# Patient Record
Sex: Female | Born: 1935 | Race: White | Hispanic: No | Smoking: Never smoker
Health system: Southern US, Community
[De-identification: ages and names within clinical notes are randomized; demographics above are authoritative.]

## PROBLEM LIST (undated history)

## (undated) DIAGNOSIS — R7611 Nonspecific reaction to tuberculin skin test without active tuberculosis: Secondary | ICD-10-CM

## (undated) DIAGNOSIS — N183 Chronic kidney disease, stage 3 (moderate): Secondary | ICD-10-CM

## (undated) DIAGNOSIS — I2510 Atherosclerotic heart disease of native coronary artery without angina pectoris: Secondary | ICD-10-CM

## (undated) DIAGNOSIS — F32A Depression, unspecified: Secondary | ICD-10-CM

## (undated) DIAGNOSIS — M353 Polymyalgia rheumatica: Secondary | ICD-10-CM

## (undated) DIAGNOSIS — D649 Anemia, unspecified: Secondary | ICD-10-CM

## (undated) DIAGNOSIS — E039 Hypothyroidism, unspecified: Secondary | ICD-10-CM

## (undated) DIAGNOSIS — Z8719 Personal history of other diseases of the digestive system: Secondary | ICD-10-CM

## (undated) DIAGNOSIS — I10 Essential (primary) hypertension: Secondary | ICD-10-CM

## (undated) DIAGNOSIS — D472 Monoclonal gammopathy: Secondary | ICD-10-CM

## (undated) DIAGNOSIS — H3530 Unspecified macular degeneration: Secondary | ICD-10-CM

## (undated) DIAGNOSIS — F0390 Unspecified dementia without behavioral disturbance: Secondary | ICD-10-CM

## (undated) DIAGNOSIS — Z8673 Personal history of transient ischemic attack (TIA), and cerebral infarction without residual deficits: Secondary | ICD-10-CM

## (undated) DIAGNOSIS — E785 Hyperlipidemia, unspecified: Secondary | ICD-10-CM

## (undated) DIAGNOSIS — Z66 Do not resuscitate: Secondary | ICD-10-CM

## (undated) DIAGNOSIS — F329 Major depressive disorder, single episode, unspecified: Secondary | ICD-10-CM

## (undated) DIAGNOSIS — G43909 Migraine, unspecified, not intractable, without status migrainosus: Secondary | ICD-10-CM

## (undated) DIAGNOSIS — M1990 Unspecified osteoarthritis, unspecified site: Secondary | ICD-10-CM

## (undated) DIAGNOSIS — K219 Gastro-esophageal reflux disease without esophagitis: Secondary | ICD-10-CM

## (undated) DIAGNOSIS — K449 Diaphragmatic hernia without obstruction or gangrene: Secondary | ICD-10-CM

## (undated) DIAGNOSIS — H9190 Unspecified hearing loss, unspecified ear: Secondary | ICD-10-CM

## (undated) HISTORY — DX: Anemia, unspecified: D64.9

## (undated) HISTORY — DX: Nonspecific reaction to tuberculin skin test without active tuberculosis: R76.11

## (undated) HISTORY — DX: Polymyalgia rheumatica: M35.3

## (undated) HISTORY — DX: Diaphragmatic hernia without obstruction or gangrene: K44.9

## (undated) HISTORY — PX: THYROIDECTOMY: SHX17

## (undated) HISTORY — DX: Do not resuscitate: Z66

## (undated) HISTORY — DX: Unspecified osteoarthritis, unspecified site: M19.90

## (undated) HISTORY — DX: Major depressive disorder, single episode, unspecified: F32.9

## (undated) HISTORY — DX: Gastro-esophageal reflux disease without esophagitis: K21.9

## (undated) HISTORY — DX: Hypothyroidism, unspecified: E03.9

## (undated) HISTORY — DX: Depression, unspecified: F32.A

## (undated) HISTORY — PX: CHOLECYSTECTOMY: SHX55

## (undated) HISTORY — DX: Monoclonal gammopathy: D47.2

## (undated) HISTORY — DX: Personal history of other diseases of the digestive system: Z87.19

## (undated) HISTORY — PX: APPENDECTOMY: SHX54

## (undated) HISTORY — DX: Hyperlipidemia, unspecified: E78.5

## (undated) HISTORY — DX: Unspecified hearing loss, unspecified ear: H91.90

## (undated) HISTORY — DX: Essential (primary) hypertension: I10

## (undated) HISTORY — DX: Atherosclerotic heart disease of native coronary artery without angina pectoris: I25.10

## (undated) HISTORY — PX: DILATION AND CURETTAGE OF UTERUS: SHX78

## (undated) HISTORY — DX: Unspecified macular degeneration: H35.30

## (undated) HISTORY — DX: Personal history of transient ischemic attack (TIA), and cerebral infarction without residual deficits: Z86.73

## (undated) HISTORY — DX: Chronic kidney disease, stage 3 (moderate): N18.3

## (undated) HISTORY — DX: Migraine, unspecified, not intractable, without status migrainosus: G43.909

## (undated) HISTORY — DX: Unspecified dementia, unspecified severity, without behavioral disturbance, psychotic disturbance, mood disturbance, and anxiety: F03.90

---

## 2004-12-27 HISTORY — PX: COLONOSCOPY: SHX174

## 2005-12-27 HISTORY — PX: OTHER SURGICAL HISTORY: SHX169

## 2007-12-28 DIAGNOSIS — Z8673 Personal history of transient ischemic attack (TIA), and cerebral infarction without residual deficits: Secondary | ICD-10-CM

## 2007-12-28 HISTORY — DX: Personal history of transient ischemic attack (TIA), and cerebral infarction without residual deficits: Z86.73

## 2008-12-27 DIAGNOSIS — Z8719 Personal history of other diseases of the digestive system: Secondary | ICD-10-CM

## 2008-12-27 HISTORY — PX: OTHER SURGICAL HISTORY: SHX169

## 2008-12-27 HISTORY — DX: Personal history of other diseases of the digestive system: Z87.19

## 2009-06-02 ENCOUNTER — Encounter: Payer: Self-pay | Admitting: Internal Medicine

## 2009-07-27 HISTORY — PX: ESOPHAGOGASTRODUODENOSCOPY: SHX1529

## 2009-07-28 ENCOUNTER — Encounter: Payer: Self-pay | Admitting: Internal Medicine

## 2009-12-27 HISTORY — PX: OTHER SURGICAL HISTORY: SHX169

## 2010-01-16 LAB — SEDIMENTATION RATE: Sed Rate: 93 mm

## 2010-01-16 LAB — COMPREHENSIVE METABOLIC PANEL
ALT: 12 U/L (ref 7–35)
BUN: 16 mg/dL (ref 4–21)
Calcium: 9.4 mg/dL
Chloride: 99 mmol/L
Creat: 1.1
Glucose: 93
Potassium: 4.4 mmol/L
Total Bilirubin: 0.6 mg/dL

## 2010-02-15 ENCOUNTER — Ambulatory Visit: Payer: Self-pay | Admitting: Cardiovascular Disease

## 2010-02-15 ENCOUNTER — Inpatient Hospital Stay: Payer: Self-pay | Admitting: *Deleted

## 2010-09-08 ENCOUNTER — Observation Stay: Payer: Self-pay | Admitting: Internal Medicine

## 2010-09-08 ENCOUNTER — Encounter: Payer: Self-pay | Admitting: Internal Medicine

## 2010-09-09 ENCOUNTER — Encounter: Payer: Self-pay | Admitting: Cardiology

## 2010-09-24 ENCOUNTER — Ambulatory Visit: Payer: Self-pay | Admitting: Internal Medicine

## 2010-10-20 LAB — FERRITIN: Ferritin: 58 ng/mL (ref 9.0–150.0)

## 2010-10-20 LAB — CBC AND DIFFERENTIAL
HCT: 0 %
Hemoglobin: 10.2 g/dL — AB (ref 12.0–16.0)
MCV: 96 fL

## 2010-12-08 ENCOUNTER — Inpatient Hospital Stay: Payer: Self-pay | Admitting: Cardiovascular Disease

## 2010-12-09 ENCOUNTER — Encounter: Payer: Self-pay | Admitting: Internal Medicine

## 2011-01-26 NOTE — Assessment & Plan Note (Signed)
Summary: NP6/AMD   Visit Type:  Initial Consult Primary Becky Adkins:  Becky Adkins.  CC:  F/U [facility].  She has been in and out of the hospital since February with chest pain.Marland Kitchen  History of Present Illness: Becky Adkins is 77 y/o woman (mother-in-law of Becky Adkins) with multiple medical problems including HTN, hyperlipidemia, polymyalgia rheumatic, GIB with gastric ulcer 1/11 and short-term memory loss.  Began to have CP in June 2010. Had GI eval and nuclear study at [facility]. Nuclear study showed EF 70% with normal perfusion. EGD showed severe GERD and hiatal hernia.   In January 2011, had severe GIB due to gastric ulcer. Had recurrent CP in Feburary 2011. Admitted to [facility] and had another stress test which was normal. Admitted 2 weeks ago to [facility] with recurrent CP. Cardiac markers normal. Treated medically.   Now lives in assisted living center. Very independent. Does all ADLs without problems. Has not had recurrent CP since episode 2 weeks ago.  Very active. Walks 20-30 mins/day without CP or SOB. No swelling, othorpnea or PND. Takes chronic prednisone for PMR.   Preventive Screening-Counseling & Management  Alcohol-Tobacco     Smoking Status: never  Caffeine-Diet-Exercise     Does Patient Exercise: yes      Drug Use:  no.    Current Medications (verified): 1)  Nexium 40 Mg Cpdr (Esomeprazole Magnesium) .... One Tablet Once Daily 2)  Prednisone 5 Mg/71ml Soln (Prednisone) .... One Tablet Once Daily 3)  Citalopram Hydrobromide 20 Mg Tabs (Citalopram Hydrobromide) .... 1/2 Tablet Once Daily 4)  Synthroid 88 Mcg Tabs (Levothyroxine Sodium) .... One Tablet Once Daily 5)  Losartan Potassium 50 Mg Tabs (Losartan Potassium) .... One Tablet Once Daily 6)  Aspir-Low 81 Mg Tbec (Aspirin) .... One Tablet Once Daily 7)  Coreg 3.125 Mg Tabs (Carvedilol) .... One Tablet Once Daily 8)  Preservision Areds 2  Caps (Multiple Vitamins-Minerals) .... One Tablet Two Times A Day  Allergies  (verified): 1)  ! Pcn 2)  ! Codeine  Past History:  Past Surgical History: Last updated: 2010/10/12 thyroidectomy appendectomy  Family History: Last updated: 10/12/2010 Mother: Deceased; cancer Father: CAD  Social History: Last updated: 10/12/2010 Widowed  Tobacco Use - No.  Alcohol Use - no Regular Exercise - yes--walks Drug Use - no Retired--R.N.  Risk Factors: Exercise: yes (10-12-2010)  Risk Factors: Smoking Status: never (10-12-10)  Past Medical History: Hypertension Hypothyroidism Hyperlipidemia Polymyalgia rheumatica   --on chronic prednisone CP   --Myoview at [facility] 6/11. EF 70% normal perfusion   --Myoview 2/11 [facility]) - normal osteoporosis hearing loss dementia peptic ulcer disease not otherwise specified   --GIB 1/11 requiring transfusion hiatal hernia TIA  Family History: Reviewed history and no changes required. Mother: Deceased; cancer Father: CAD  Social History: Reviewed history and no changes required. Widowed  Tobacco Use - No.  Alcohol Use - no Regular Exercise - yes--walks Drug Use - no Retired--R.N. Smoking Status:  never Does Patient Exercise:  yes Drug Use:  no  Review of Systems       As per HPI and past medical history; otherwise all systems negative.   Vital Signs:  Patient profile:   77 year old female Height:      61 inches Weight:      148 pounds BMI:     28.07 Pulse rate:   72 / minute BP sitting:   130 / 80  (left arm)  Vitals Entered By: Bishop Dublin, CMA (October 12, 2010 3:22 PM)  Physical Exam  General:  Elderly. somewhat frail no resp difficulty HEENT: normal Neck: supple. no JVD. Carotids 2+ bilat; no bruits. No lymphadenopathy or thryomegaly appreciated. Cor: PMI nondisplaced. Regular rate & rhythm. No rubs, gallops, murmur. Lungs: clear Abdomen: soft, nontender, nondistended. Good bowel sounds. Extremities: no cyanosis, clubbing, rash, edema. + arthritic changes Neuro: alert &  orientedx3, cranial nerves grossly intact. moves all 4 extremities w/o difficulty. affect pleasant    Impression & Recommendations:  Problem # 1:  CHEST PAIN UNSPECIFIED (ICD-786.50) Given normal stres tests, I suspect CP is non-cardiac but she does have multiple CRFs.  I discussed options with her and her son of how to proceed  1) Continue to watch and wait 2) Proceed with diagnostic heart cath 3) cardiacCT (likely self-pay)  We also discussed the fact that if she had blockage she would require dual anti-platelet therapy for at least 30 days which would carry risk of GIB. Given the fact that she is asx at this point will continue medical therapy. If symptoms recur in near future can pursue further work-up. I have given them my cell number and her daughter-in-law (who is cardia nurse) can call me at any time.   Other Orders: EKG w/ Interpretation (93000)  Patient Instructions: 1)  Return in 4-6 months or sooner, if needed.

## 2011-01-26 NOTE — Procedures (Signed)
Summary: Upper GI Endoscopy  Upper GI Endoscopy   Imported By: Roderic Ovens 10/15/2010 10:26:27  _____________________________________________________________________  External Attachment:    Type:   Image     Comment:   External Document

## 2011-01-26 NOTE — Letter (Signed)
Summary: Patient Receipt of NNP  Patient Receipt of NNP   Imported By: Marylou Mccoy 10/07/2010 13:24:38  _____________________________________________________________________  External Attachment:    Type:   Image     Comment:   External Document

## 2011-01-26 NOTE — Letter (Signed)
[facility]   Imported By: Harlon Flor 09/14/2010 12:12:49  _____________________________________________________________________  External Attachment:    Type:   Image     Comment:   External Document

## 2011-01-28 NOTE — Cardiovascular Report (Signed)
SummaryScientist, physiological [facility] Cath Report   [facility] Cath Report   Imported By: Roderic Ovens 12/31/2010 15:38:02  _____________________________________________________________________  External Attachment:    Type:   Image     Comment:   External Document

## 2011-02-04 ENCOUNTER — Telehealth (INDEPENDENT_AMBULATORY_CARE_PROVIDER_SITE_OTHER): Payer: Self-pay | Admitting: *Deleted

## 2011-02-11 NOTE — Progress Notes (Signed)
Summary: Called pt  Phone Note Outgoing Call Call back at [phone]   Call placed by: Harlon Flor,  February 04, 2011 3:24 PM Call placed to: Son Summary of Call: [facility] for son TCB to schedule appt.  He had LMOM for [place] to call him regarding this. Initial call taken by: Harlon Flor,  February 04, 2011 3:25 PM

## 2011-03-23 ENCOUNTER — Ambulatory Visit (INDEPENDENT_AMBULATORY_CARE_PROVIDER_SITE_OTHER): Payer: Medicare Other | Admitting: Internal Medicine

## 2011-03-23 ENCOUNTER — Encounter: Payer: Self-pay | Admitting: Internal Medicine

## 2011-03-23 VITALS — BP 108/62 | HR 66 | Ht 61.0 in | Wt 153.0 lb

## 2011-03-23 DIAGNOSIS — I2510 Atherosclerotic heart disease of native coronary artery without angina pectoris: Secondary | ICD-10-CM

## 2011-03-23 DIAGNOSIS — I25118 Atherosclerotic heart disease of native coronary artery with other forms of angina pectoris: Secondary | ICD-10-CM | POA: Insufficient documentation

## 2011-03-23 DIAGNOSIS — E785 Hyperlipidemia, unspecified: Secondary | ICD-10-CM

## 2011-03-23 MED ORDER — PRAVASTATIN SODIUM 20 MG PO TABS: 20.0000 mg | ORAL_TABLET | Freq: Every evening | ORAL | Status: DC

## 2011-03-23 MED ORDER — LOSARTAN POTASSIUM 25 MG PO TABS: 25.0000 mg | ORAL_TABLET | Freq: Every day | ORAL | Status: DC

## 2011-03-23 MED ORDER — ISOSORBIDE MONONITRATE ER 30 MG PO TB24: 30.0000 mg | ORAL_TABLET | Freq: Two times a day (BID) | ORAL | Status: DC

## 2011-03-23 NOTE — Assessment & Plan Note (Signed)
As above, starting pravastatin.

## 2011-03-23 NOTE — Progress Notes (Signed)
HPI:  Becky Adkins is 77 y/o woman (mother-in-law of Diane Prospero) with multiple medical problems including HTN, hyperlipidemia, polymyalgia rheumatic, GIB with gastric ulcer 1/11 and short-term memory loss.  Began to have CP in June 2010. Had GI eval and nuclear study at [facility]. Nuclear study showed EF 70% with normal perfusion. EGD showed severe GERD and hiatal hernia.   In January 2011, had severe GIB due to gastric ulcer. Had several episodes of recurrent CP and then underwent cath 12/11 at [facility]. Showed severe 3-v CAD  Including LM 80% LAD 70% LCX 90% RCA 50-60%. Transferred to [facility] to discuss options regarding CABG vs LM stent. Saw Dr. Nolon Rod. Given comorbidities decide to treat medically after review with Tretha Sciara and Nathaniel Man.   Currently staying at [facility]. She denies any CP or pressure although nurses at there report to family that she occasionally reports CP. On Saturday they were walking in mall and had episode of exertional dyspnea. No CP or diaphoresis. Continues to have severe problems with her memory. Continues on prednisone for PMR. Unable tolerate atorva in past.     ROS: All systems negative except as listed in HPI, PMH and Problem List.  Past Medical History  Diagnosis Date  . Hypertension   . Hyperlipidemia   . Polymyalgia rheumatica   . GIB (gastrointestinal bleeding) 12/27/2009    w/ gastric ulcer  . Short-term memory loss   . Hiatal hernia   . TIA (transient ischemic attack)     Current Outpatient Prescriptions  Medication Sig Dispense Refill  . aspirin 81 MG EC tablet Take 81 mg by mouth daily.        . citalopram (CELEXA) 20 MG tablet Take 10 mg by mouth daily.        . isosorbide mononitrate (IMDUR) 30 MG 24 hr tablet Take 30 mg by mouth daily.        Marland Kitchen levothyroxine (SYNTHROID, LEVOTHROID) 88 MCG tablet Take 88 mcg by mouth daily.        Marland Kitchen losartan (COZAAR) 50 MG tablet Take 50 mg by mouth daily.        . metoprolol tartrate (LOPRESSOR) 25 MG tablet  Take 25 mg by mouth 2 (two) times daily.        . Multiple Vitamins-Minerals (PRESERVISION AREDS 2 PO) Take 1 capsule by mouth 2 (two) times daily.        . nitroGLYCERIN (NITROSTAT) 0.4 MG SL tablet Place 0.4 mg under the tongue every 5 (five) minutes as needed.        . pantoprazole (PROTONIX) 40 MG tablet Take 40 mg by mouth daily.        . predniSONE 5 MG/ML concentrated solution Take 5 mg by mouth daily.        . carvedilol (COREG) 3.125 MG tablet Take 3.125 mg by mouth daily.        Marland Kitchen esomeprazole (NEXIUM) 40 MG capsule Take 40 mg by mouth daily before breakfast.           PHYSICAL EXAM: Filed Vitals:   03/23/11 1555  BP: 108/62  Pulse: 66  General:  Elderly. somewhat frail no resp difficulty HEENT: normal Neck: supple. no JVD. Carotids 2+ bilat; no bruits. No lymphadenopathy or thryomegaly appreciated. Old thyroid scar Cor: PMI nondisplaced. Regular rate & rhythm. No rubs, gallops, murmur. Lungs: clear Abdomen: soft, nontender, nondistended. Good bowel sounds. Extremities: no cyanosis, clubbing, rash, edema. + arthritic changes Neuro: alert & orientedx3, cranial nerves grossly intact. moves  all 4 extremities w/o difficulty. affect pleasant   ECG: NSR 66 No ST-T wave abnormalities.     ASSESSMENT & PLAN:

## 2011-03-23 NOTE — Patient Instructions (Addendum)
INCREASE Imdur 30mg  to twice daily. DECREASE Losartan to 25mg  once daily. Your physician recommends you follow a low fat, low cholesterol diet. Your physician recommends that you schedule a follow-up appointment in: 1 month

## 2011-03-23 NOTE — Assessment & Plan Note (Signed)
Severe CAD on cath. Attempting to treat medically. Appears to be having ongoing angina but hard to tell given her memory difficulties. Will increase Imdur to 30 bid. (Cut Losartan to 25 qd to make room for it). Start low-dose pravastatin (as tolerated) to hopefully slow CAD progression. Switch to lowfat diet at [employer].

## 2011-04-28 ENCOUNTER — Ambulatory Visit (INDEPENDENT_AMBULATORY_CARE_PROVIDER_SITE_OTHER): Payer: Medicare Other | Admitting: Cardiovascular Disease

## 2011-04-28 ENCOUNTER — Encounter: Payer: Self-pay | Admitting: Cardiovascular Disease

## 2011-04-28 DIAGNOSIS — E785 Hyperlipidemia, unspecified: Secondary | ICD-10-CM

## 2011-04-28 DIAGNOSIS — I2510 Atherosclerotic heart disease of native coronary artery without angina pectoris: Secondary | ICD-10-CM

## 2011-04-28 DIAGNOSIS — R079 Chest pain, unspecified: Secondary | ICD-10-CM

## 2011-04-28 NOTE — Assessment & Plan Note (Signed)
Severe coronary artery disease, medical management recommended. Currently with no significant chest pain symptoms on isosorbide 30 mg b.i.d..

## 2011-04-28 NOTE — Assessment & Plan Note (Signed)
We have recommended that she continue to treat episodes of chest pain with sublingual nitroglycerin. Continue long-acting nitrates and beta blockers.

## 2011-04-28 NOTE — Patient Instructions (Addendum)
You are doing well. No medication changes were made. We will check your cholesterol on 05/21/2011 (lipid/lft) Please call us if you have new issues that need to be addressed before your next appt.  We will call you for a follow up Appt. In 6 months

## 2011-04-28 NOTE — Progress Notes (Signed)
   Patient ID: Becky Adkins, female    DOB: 1925-10-27, 77 y.o.   MRN: [id number]  HPI Comments: Yolani is 77 y/o woman (mother-in-law of Diane Dineen) with multiple medical problems including HTN, hyperlipidemia, polymyalgia rheumatic, GIB with gastric ulcer 1/11 and short-term memory loss,  severe GIB in January 2011 due to gastric ulcer,  cath 12/11 at [facility] for chest pain,  showed severe 3-v CAD  Including LM 80% LAD 70% LCX 90% RCA 50-60%. Transferred to [facility] to discuss options regarding CABG vs LM stent. Saw Dr. Nolon Rod. Given comorbidities decide to treat medically after review with Tretha Sciara and Nathaniel Man.   Currently staying at [facility]. She denies any CP or pressure. Continues to have severe problems with her memory. Continues on prednisone for PMR. Unable tolerate atorva in past. She is tolerating pravastatin 20 mg daily.  Her son reports that overall she has been relatively stable. She has a stable gait, enjoys life at the nursing home and has not been requiring a high level of care.  EKG shows normal sinus rhythm with rate 65 beats per minute, rare PVC, no significant ST or T wave changes           Review of Systems  Constitutional: Negative.   HENT: Negative.   Eyes: Negative.   Respiratory: Negative.   Cardiovascular: Negative.   Gastrointestinal: Negative.   Musculoskeletal: Positive for gait problem.  Skin: Negative.   Neurological: Negative.        Memory problems  Hematological: Negative.   Psychiatric/Behavioral: Negative.   All other systems reviewed and are negative.    BP 100/68  Pulse 66  Ht 5\' 1"  (1.549 m)  Wt 157 lb (71.215 kg)  BMI 29.67 kg/m2   Physical Exam  Nursing note and vitals reviewed. Constitutional: She appears well-developed and well-nourished.  HENT:  Head: Normocephalic.  Nose: Nose normal.  Mouth/Throat: Oropharynx is clear and moist.  Eyes: Conjunctivae are normal. Pupils are equal, round, and  reactive to light.  Neck: Normal range of motion. Neck supple. No JVD present.  Cardiovascular: Normal rate, regular rhythm, normal heart sounds and intact distal pulses.  Exam reveals no gallop and no friction rub.   No murmur heard. Pulmonary/Chest: Effort normal and breath sounds normal. No respiratory distress. She has no wheezes. She has no rales. She exhibits no tenderness.  Abdominal: Soft. Bowel sounds are normal. She exhibits no distension. There is no tenderness.  Musculoskeletal: Normal range of motion. She exhibits no edema and no tenderness.  Lymphadenopathy:    She has no cervical adenopathy.  Neurological: She is alert. Coordination normal.  Skin: Skin is warm and dry. No rash noted. No erythema.  Psychiatric: She has a normal mood and affect. Her behavior is normal. Thought content normal.         Assessment and Plan

## 2011-04-28 NOTE — Assessment & Plan Note (Signed)
We have recommended that we check her cholesterol later this week. Ideally we'll try to push her cholesterol as low as possible given previous side effects.

## 2011-05-21 ENCOUNTER — Ambulatory Visit (INDEPENDENT_AMBULATORY_CARE_PROVIDER_SITE_OTHER): Payer: Medicare Other | Admitting: *Deleted

## 2011-05-21 DIAGNOSIS — E785 Hyperlipidemia, unspecified: Secondary | ICD-10-CM

## 2011-05-21 LAB — HEPATIC FUNCTION PANEL
AST: 22 U/L (ref 0–37)
Albumin: 3.8 g/dL (ref 3.5–5.2)
Alkaline Phosphatase: 58 U/L (ref 39–117)
Indirect Bilirubin: 0.2 mg/dL (ref 0.0–0.9)
Total Bilirubin: 0.3 mg/dL (ref 0.3–1.2)

## 2011-05-21 LAB — LIPID PANEL
HDL: 54 mg/dL (ref >39)
Triglycerides: 167 mg/dL — ABNORMAL HIGH (ref <150)

## 2011-05-25 ENCOUNTER — Encounter: Payer: Self-pay | Admitting: Cardiovascular Disease

## 2011-07-12 ENCOUNTER — Encounter: Payer: Self-pay | Admitting: Family Medicine

## 2011-07-13 ENCOUNTER — Encounter: Payer: Self-pay | Admitting: Family Medicine

## 2011-07-13 ENCOUNTER — Ambulatory Visit (INDEPENDENT_AMBULATORY_CARE_PROVIDER_SITE_OTHER): Payer: Medicare Other | Admitting: Family Medicine

## 2011-07-13 VITALS — BP 132/78 | HR 64 | Temp 98.1°F | Ht 60.0 in | Wt 159.8 lb

## 2011-07-13 DIAGNOSIS — H3530 Unspecified macular degeneration: Secondary | ICD-10-CM | POA: Insufficient documentation

## 2011-07-13 DIAGNOSIS — F329 Major depressive disorder, single episode, unspecified: Secondary | ICD-10-CM

## 2011-07-13 DIAGNOSIS — M810 Age-related osteoporosis without current pathological fracture: Secondary | ICD-10-CM | POA: Insufficient documentation

## 2011-07-13 DIAGNOSIS — Z8719 Personal history of other diseases of the digestive system: Secondary | ICD-10-CM

## 2011-07-13 DIAGNOSIS — F331 Major depressive disorder, recurrent, moderate: Secondary | ICD-10-CM | POA: Insufficient documentation

## 2011-07-13 DIAGNOSIS — F0390 Unspecified dementia without behavioral disturbance: Secondary | ICD-10-CM

## 2011-07-13 DIAGNOSIS — I2510 Atherosclerotic heart disease of native coronary artery without angina pectoris: Secondary | ICD-10-CM

## 2011-07-13 DIAGNOSIS — E785 Hyperlipidemia, unspecified: Secondary | ICD-10-CM

## 2011-07-13 DIAGNOSIS — Z8673 Personal history of transient ischemic attack (TIA), and cerebral infarction without residual deficits: Secondary | ICD-10-CM

## 2011-07-13 DIAGNOSIS — F32A Depression, unspecified: Secondary | ICD-10-CM

## 2011-07-13 DIAGNOSIS — M353 Polymyalgia rheumatica: Secondary | ICD-10-CM

## 2011-07-13 DIAGNOSIS — K219 Gastro-esophageal reflux disease without esophagitis: Secondary | ICD-10-CM

## 2011-07-13 DIAGNOSIS — E039 Hypothyroidism, unspecified: Secondary | ICD-10-CM

## 2011-07-13 DIAGNOSIS — I10 Essential (primary) hypertension: Secondary | ICD-10-CM

## 2011-07-13 MED ORDER — ATORVASTATIN CALCIUM 20 MG PO TABS: 20.0000 mg | ORAL_TABLET | Freq: Every day | ORAL | Status: DC

## 2011-07-13 NOTE — Assessment & Plan Note (Signed)
Reviewed, input records from prior PCP.

## 2011-07-13 NOTE — Assessment & Plan Note (Signed)
Good control on current regimen. BP: 132/78 mmHg   Goal <130/80.

## 2011-07-13 NOTE — Assessment & Plan Note (Signed)
Per son and last cards note, had discussed changing to more potent statin. Changed to atorva 20mg  daily, advised to watch for myalgias.

## 2011-07-13 NOTE — Assessment & Plan Note (Signed)
Continue celexa 30mg  daily.

## 2011-07-13 NOTE — Assessment & Plan Note (Signed)
Check TSH today

## 2011-07-13 NOTE — Assessment & Plan Note (Signed)
Has seen memory clinic, no formal diagnosis according to son, have noticed improvement since moving to nursing home.  Did not tolerate aricept in past.

## 2011-07-13 NOTE — Patient Instructions (Addendum)
We've changed cholesterol medicine from pravastatin to lipitor (atorvatatin) 20mg  nightly.  Watch out for muscle pain with this med, if that happens let us know. Check about left lung spot seen 12/2009. Blood work today. Check on tetanus, shingles, and pneumonia shots. Return at your convenience for annual medicare wellness visit. Good to meet you today, call us with questions.

## 2011-07-13 NOTE — Assessment & Plan Note (Signed)
On baby ASA, stable.

## 2011-07-13 NOTE — Assessment & Plan Note (Signed)
Stable on ASA, no chest pain. Medically managing 3v disease.

## 2011-07-13 NOTE — Assessment & Plan Note (Signed)
Refer to ophtho

## 2011-07-13 NOTE — Assessment & Plan Note (Signed)
Not candidate for plavix or stronger anti platelet agent other than baby ASA.

## 2011-07-13 NOTE — Assessment & Plan Note (Signed)
No recent dexa noted. Was on boniva. Continue to monitor for now.   Check vit D level today. Likely avoid Ca given CAD.

## 2011-07-13 NOTE — Progress Notes (Signed)
Subjective:    Patient ID: Becky Adkins, female    DOB: 05/14/1925, 77 y.o.   MRN: [id number]  HPI CC: new patient, establish  Presents with son.    Was living alone in [place] until 1 1/2 yr ago.  Now lives in [place], [place].  Overall things going well.  Feels doing better as able to be more social.  Also has noticed significant improvement since increase in celexa to 30mg  daily.  Endorses feeling happy, improved insight into illness, into dementia.  Last vision check was >1 yr ago, requests referral.  Had cataract surgery at [facility] almost 2 yrs ago.  H/o 3v CAD, decided medical management given comorbidities, only on 81mg  ASA.  Denies recent CP/tightness, SOB.  Cards - [place]. S/p thyroidectomy for tumor 1980s, hypothyroid since. H/o PMR - on daily prednisone 5mg .  Dx 1980s. GIB - 2010.  Very serious bleed.  Protonix indefinitely. Osteoporosis/osteopenia - No recent dexa scan. Dementia - ongoing for several decades, has seen memory clinic at [facility], last seen 2008, never formal diagnosis.  Enrolled in study.  More of an issue with short term memory.  Tried aricept, didn't tolerate or really help.  On celexa 30mg .  Medications and allergies reviewed and updated in chart. Patient Active Problem List  Diagnoses  . CHEST PAIN UNSPECIFIED  . CAD (coronary artery disease)  . Hyperlipidemia  . Hypertension  . Polymyalgia rheumatica  . Dementia  . History of GI bleed  . Personal history of TIA (transient ischemic attack)  . Macular degeneration  . GERD (gastroesophageal reflux disease)  . Hypothyroid  . Osteoporosis  . Depression   Past Medical History  Diagnosis Date  . Hypertension   . Hyperlipidemia   . Polymyalgia rheumatica     on prednisone, has seen neuroophthalmologist  . History of GI bleed 12/2008    w/ gastric ulcer  . Dementia     mild, significant short term memory loss  . Hiatal hernia   . Personal history of TIA (transient ischemic attack) 2009     neurology w/u with normal CT head, echo, carotid US  . Anemia     s/p hematology workup  . Macular degeneration     and cataracts  . Deafness     right ear  . GERD (gastroesophageal reflux disease)     severe on EGD with HH, H pylori neg  . Hypothyroid     s/p thyroidectomy  . Osteoporosis     was on boniva  . 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 Migraines     none recently  . Arthritis   . Depression   . Urinary incontinence    Past Surgical History  Procedure Date  . Thyroidectomy   . Appendectomy   . Dilation and curettage of uterus   . Hospitalization 2007    global amnesia  . Hospitalization 12/2008    bleeding ulcer, asp PNA  . Hospitalization 2011    CP, r/o x 2 (neg cardiolite and ETT)  . Esophagogastroduodenoscopy 07/2009    LA grade C reflux esophagitis, HH, single gastric nodule ([place])  . Cholecystectomy    History  Substance Use Topics  . Smoking status: Never Smoker   . Smokeless tobacco: Never Used  . Alcohol Use: Yes     Wine very rarely   Family History  Problem Relation Age of Onset  . Cancer Mother     cervical  .  Coronary artery disease Father   . Alcohol abuse Father   . Diabetes Paternal Aunt    Allergies  Allergen Reactions  . Codeine   . Penicillins    Current Outpatient Prescriptions on File Prior to Visit  Medication Sig Dispense Refill  . aspirin 81 MG EC tablet Take 81 mg by mouth daily.        . citalopram (CELEXA) 20 MG tablet Take 30 mg by mouth daily. 1 1/2 tablets daily      . isosorbide mononitrate (IMDUR) 30 MG 24 hr tablet Take 1 tablet (30 mg total) by mouth 2 (two) times daily.  60 tablet  6  . levothyroxine (SYNTHROID, LEVOTHROID) 88 MCG tablet Take 88 mcg by mouth daily.        Marland Kitchen losartan (COZAAR) 25 MG tablet Take 1 tablet (25 mg total) by mouth daily.  30 tablet  6  . metoprolol tartrate (LOPRESSOR) 25 MG tablet Take 25 mg by mouth 2 (two) times daily.        . Multiple  Vitamins-Minerals (PRESERVISION AREDS 2 PO) Take 1 capsule by mouth 2 (two) times daily.        . nitroGLYCERIN (NITROSTAT) 0.4 MG SL tablet Place 0.4 mg under the tongue every 5 (five) minutes as needed.        . pantoprazole (PROTONIX) 40 MG tablet Take 40 mg by mouth daily.         Review of Systems  Constitutional: Negative for fever, chills, activity change, appetite change, fatigue and unexpected weight change.  HENT: Negative for hearing loss and neck pain.   Eyes: Negative for visual disturbance.  Respiratory: Negative for cough, chest tightness, shortness of breath and wheezing.   Cardiovascular: Negative for chest pain, palpitations and leg swelling.  Gastrointestinal: Negative for nausea, vomiting, abdominal pain, diarrhea, constipation, blood in stool and abdominal distention.  Genitourinary: Negative for hematuria and difficulty urinating.  Musculoskeletal: Negative for myalgias and arthralgias.  Skin: Negative for rash.  Neurological: Negative for dizziness, seizures, syncope and headaches.  Hematological: Does not bruise/bleed easily.  Psychiatric/Behavioral: Negative for dysphoric mood. The patient is not nervous/anxious.        Objective:   Physical Exam  Nursing note and vitals reviewed. Constitutional: She is oriented to person, place, and time. She appears well-developed and well-nourished. No distress.  HENT:  Head: Normocephalic and atraumatic.  Right Ear: Tympanic membrane, external ear and ear canal normal.  Left Ear: Hearing, tympanic membrane, external ear and ear canal normal.  Nose: Nose normal. No mucosal edema or rhinorrhea.  Mouth/Throat: Uvula is midline, oropharynx is clear and moist and mucous membranes are normal. No oropharyngeal exudate, posterior oropharyngeal edema, posterior oropharyngeal erythema or tonsillar abscesses.       R ear hearing loss  Eyes: Conjunctivae and EOM are normal. Pupils are equal, round, and reactive to light. No scleral  icterus.  Neck: Normal range of motion. Neck supple.  Cardiovascular: Normal rate, regular rhythm, normal heart sounds and intact distal pulses.   No murmur heard. Pulses:      Radial pulses are 2+ on the right side, and 2+ on the left side.  Pulmonary/Chest: Effort normal and breath sounds normal. No respiratory distress. She has no wheezes. She has no rales.  Abdominal: Soft. Bowel sounds are normal. She exhibits no distension and no mass. There is no tenderness. There is no rebound and no guarding.  Musculoskeletal: Normal range of motion. She exhibits no edema.  Lymphadenopathy:  She has no cervical adenopathy.  Neurological: She is alert and oriented to person, place, and time.       CN grossly intact, station and gait intact  Skin: Skin is warm and dry. No rash noted.  Psychiatric: She has a normal mood and affect. Her behavior is normal. Judgment and thought content normal.          Assessment & Plan:

## 2011-07-14 LAB — BASIC METABOLIC PANEL
BUN: 24 mg/dL — ABNORMAL HIGH (ref 6–23)
Chloride: 105 mEq/L (ref 96–112)
GFR: 30.89 mL/min — ABNORMAL LOW (ref >60.00)
Glucose, Bld: 113 mg/dL — ABNORMAL HIGH (ref 70–99)
Potassium: 5.1 mEq/L (ref 3.5–5.1)

## 2011-07-14 LAB — CBC WITH DIFFERENTIAL/PLATELET
Basophils Absolute: 0 10*3/uL (ref 0.0–0.1)
Basophils Relative: 0.3 % (ref 0.0–3.0)
Eosinophils Absolute: 0 10*3/uL (ref 0.0–0.7)
Eosinophils Relative: 0.5 % (ref 0.0–5.0)
HCT: 35.7 % — ABNORMAL LOW (ref 36.0–46.0)
Hemoglobin: 11.8 g/dL — ABNORMAL LOW (ref 12.0–15.0)
Lymphocytes Relative: 23.4 % (ref 12.0–46.0)
Lymphs Abs: 1.5 10*3/uL (ref 0.7–4.0)
MCHC: 33.1 g/dL (ref 30.0–36.0)
MCV: 94.7 fl (ref 78.0–100.0)
Monocytes Absolute: 0.4 10*3/uL (ref 0.1–1.0)
Monocytes Relative: 6.3 % (ref 3.0–12.0)
Neutro Abs: 4.4 10*3/uL (ref 1.4–7.7)
Neutrophils Relative %: 69.5 % (ref 43.0–77.0)
Platelets: 210 10*3/uL (ref 150.0–400.0)
RBC: 3.77 Mil/uL — ABNORMAL LOW (ref 3.87–5.11)
RDW: 14.4 % (ref 11.5–14.6)
WBC: 6.3 10*3/uL (ref 4.5–10.5)

## 2011-07-18 ENCOUNTER — Telehealth: Payer: Self-pay | Admitting: Family Medicine

## 2011-07-18 MED ORDER — LEVOTHYROXINE SODIUM 75 MCG PO TABS: 75.0000 ug | ORAL_TABLET | Freq: Every day | ORAL | Status: DC

## 2011-07-18 NOTE — Telephone Encounter (Addendum)
Please notify hemoglobin 11.8. Kidney function returned elevated.  Ensure getting plenty of fluid to stay well hydrated, avoid ibuprofen, advil, motrin, alleve.  Tylenol ok. Thyroid function high which could account for kidney insufficiency.  Would like her to decrease synthroid to daily.  Changed dose in chart and sent new med to pharmacy. Vitamin D level normal.   Sedimentation rate elevated at 102. Would also like her to return mid August for OV and to recheck kidney and thyroid function.

## 2011-07-19 NOTE — Telephone Encounter (Signed)
Patient's son notified and follow up appointment scheduled. New Rx and notes faxed to [facility] at son's request.

## 2011-07-19 NOTE — Telephone Encounter (Signed)
Message left for son to return my call.

## 2011-07-21 ENCOUNTER — Ambulatory Visit: Payer: BC Managed Care – PPO | Admitting: Family Medicine

## 2011-07-22 ENCOUNTER — Ambulatory Visit (INDEPENDENT_AMBULATORY_CARE_PROVIDER_SITE_OTHER): Payer: Medicare Other | Admitting: Family Medicine

## 2011-07-22 ENCOUNTER — Ambulatory Visit (INDEPENDENT_AMBULATORY_CARE_PROVIDER_SITE_OTHER)
Admission: RE | Admit: 2011-07-22 | Discharge: 2011-07-22 | Disposition: A | Discharge status: Home / Self Care | Payer: Medicare Other | Source: Ambulatory Visit | Attending: Family Medicine | Admitting: Family Medicine

## 2011-07-22 ENCOUNTER — Encounter: Payer: Self-pay | Admitting: Family Medicine

## 2011-07-22 DIAGNOSIS — W19XXXA Unspecified fall, initial encounter: Secondary | ICD-10-CM | POA: Insufficient documentation

## 2011-07-22 DIAGNOSIS — M25561 Pain in right knee: Secondary | ICD-10-CM | POA: Insufficient documentation

## 2011-07-22 DIAGNOSIS — M25569 Pain in unspecified knee: Secondary | ICD-10-CM

## 2011-07-22 DIAGNOSIS — M810 Age-related osteoporosis without current pathological fracture: Secondary | ICD-10-CM

## 2011-07-22 NOTE — Progress Notes (Signed)
  Subjective:    Patient ID: Becky Adkins, female    DOB: 1925/03/11, 77 y.o.   MRN: [id number]  HPI CC: fall yesterday  77 yo with some dementia and PMR on daily 5mg  prednisone presents with granddaughter with fall 2 nights ago.  Son thought pt should come be evaluated.  Started walking, rubber sole of shoe got stuck on floor.  Fell down onto knees.  R knee mildly sore.  No premonitory sxs, no dizziness, HA, vision changes.  Anterior knee hurting.  Never has had imbalance with walking in past.    Knee pain worse with bending.  No locking or instability or popping.  No breaks in skin.  Able to ambulate.  Granddaughter thinks that with walking limp has decreased.  No other injury, no head injury or neck injury.  No other recent falls.  Started using walker since fall.  Thinks has been ~2 yrs since last fall.  H/o osteoporosis, h/o PUD, significant h/o CAD, currently getting medical treatment.  Review of Systems Per HPI    Objective:   Physical Exam  Nursing note and vitals reviewed. Constitutional: She appears well-developed and well-nourished. No distress.  HENT:  Head: Normocephalic and atraumatic.  Mouth/Throat: Oropharynx is clear and moist. No oropharyngeal exudate.  Eyes: Conjunctivae and EOM are normal. Pupils are equal, round, and reactive to light. No scleral icterus.  Neck: Normal range of motion. Neck supple.  Musculoskeletal:       FROM at knees. R knee - Neg mcmurrays bilaterally. Neg drawer sign. No pain at patellar ligament, no increase in patellar mobility. No pain at medial or lateral joint lines.   Neurological: She is alert. She has normal strength. No sensory deficit. She displays a negative Romberg sign. Coordination normal.       no pronator drift Walking with walker  Skin: Skin is warm and dry.       Small abrasion anterior R knee inferior to patella + ecchymosis lateral lower leg, tender to palpation anterior leg  Psychiatric: She has a  normal mood and affect.          Assessment & Plan:

## 2011-07-22 NOTE — Assessment & Plan Note (Signed)
Was on boniva.  No recent dexa, consider rpt.  However, holding off on Ca given h/o CAD.  Vit D 41 06/2011.   May need to discuss PO bisphosphonate vs IV bisphosphonate at next visit (h/o PUD).

## 2011-07-22 NOTE — Patient Instructions (Signed)
xRay obtained today -looking ok on my read.  If any change based on radiology read we will call you. I think you have a bony contusion of the lower knee as well as bruising of skin from fall. May use tylenol 500mg  three times a day as needed as well as elevate leg and place ice on it for 15 min at a time. I'd like to set you up with physical therapy to see if you'd benefit from assistive devices to walk.

## 2011-07-22 NOTE — Assessment & Plan Note (Addendum)
Referral to PT to assess need for ambulatory assistive devices.

## 2011-07-22 NOTE — Assessment & Plan Note (Signed)
Xray checked - negative for fracture. Supportive care.  Anticipate bony contusion at anterior superior tibia. Discussed anticipated recovery. Have asked PT to eval ambulation and see if assistive devices would help decrease fall risk, possible cane for stability.

## 2011-07-27 ENCOUNTER — Encounter: Payer: Self-pay | Admitting: Family Medicine

## 2011-07-29 ENCOUNTER — Other Ambulatory Visit: Payer: Self-pay | Admitting: Family Medicine

## 2011-07-29 DIAGNOSIS — M353 Polymyalgia rheumatica: Secondary | ICD-10-CM

## 2011-07-29 DIAGNOSIS — H3530 Unspecified macular degeneration: Secondary | ICD-10-CM

## 2011-07-29 DIAGNOSIS — W19XXXA Unspecified fall, initial encounter: Secondary | ICD-10-CM

## 2011-07-29 DIAGNOSIS — M25561 Pain in right knee: Secondary | ICD-10-CM

## 2011-08-13 ENCOUNTER — Ambulatory Visit (INDEPENDENT_AMBULATORY_CARE_PROVIDER_SITE_OTHER): Payer: Medicare Other | Admitting: Family Medicine

## 2011-08-13 ENCOUNTER — Encounter: Payer: Self-pay | Admitting: Family Medicine

## 2011-08-13 DIAGNOSIS — Z23 Encounter for immunization: Secondary | ICD-10-CM

## 2011-08-13 DIAGNOSIS — N289 Disorder of kidney and ureter, unspecified: Secondary | ICD-10-CM

## 2011-08-13 DIAGNOSIS — M25561 Pain in right knee: Secondary | ICD-10-CM

## 2011-08-13 DIAGNOSIS — W19XXXA Unspecified fall, initial encounter: Secondary | ICD-10-CM

## 2011-08-13 DIAGNOSIS — M353 Polymyalgia rheumatica: Secondary | ICD-10-CM

## 2011-08-13 DIAGNOSIS — N183 Chronic kidney disease, stage 3 unspecified: Secondary | ICD-10-CM

## 2011-08-13 DIAGNOSIS — I2510 Atherosclerotic heart disease of native coronary artery without angina pectoris: Secondary | ICD-10-CM

## 2011-08-13 DIAGNOSIS — M810 Age-related osteoporosis without current pathological fracture: Secondary | ICD-10-CM

## 2011-08-13 DIAGNOSIS — M25569 Pain in unspecified knee: Secondary | ICD-10-CM

## 2011-08-13 DIAGNOSIS — K219 Gastro-esophageal reflux disease without esophagitis: Secondary | ICD-10-CM

## 2011-08-13 DIAGNOSIS — E785 Hyperlipidemia, unspecified: Secondary | ICD-10-CM

## 2011-08-13 DIAGNOSIS — F0390 Unspecified dementia without behavioral disturbance: Secondary | ICD-10-CM

## 2011-08-13 DIAGNOSIS — E039 Hypothyroidism, unspecified: Secondary | ICD-10-CM

## 2011-08-13 DIAGNOSIS — D649 Anemia, unspecified: Secondary | ICD-10-CM

## 2011-08-13 DIAGNOSIS — I10 Essential (primary) hypertension: Secondary | ICD-10-CM

## 2011-08-13 HISTORY — DX: Chronic kidney disease, stage 3 unspecified: N18.30

## 2011-08-13 LAB — BASIC METABOLIC PANEL
BUN: 23 mg/dL (ref 6–23)
Creatinine, Ser: 1.4 mg/dL — ABNORMAL HIGH (ref 0.4–1.2)
GFR: 37.19 mL/min — ABNORMAL LOW (ref >60.00)
Glucose, Bld: 109 mg/dL — ABNORMAL HIGH (ref 70–99)

## 2011-08-13 LAB — TSH: TSH: 0.11 u[IU]/mL — ABNORMAL LOW (ref 0.35–5.50)

## 2011-08-13 NOTE — Patient Instructions (Addendum)
Blood work today to check on kidneys and thyroid function. Start aricept for memory - 5mg  daily.  Watch for stomach upset, if that happens let me know. Try lower calorie diet and I encourage walking daily. Check on shingles shot. Tetanus won't be covered by medicare, but the recommendation is to get every 10 years. Pneumonia shot is due once after age 75. Return at your convenience for annual medicare wellness visit. I'm glad the knee is doing better!  Check on PT for recommendations on use of walker or cane. Pneumonia shot today.  Tetanus shot today (Td).  We will hold off on shingles for now - given on steroids.  Check with rheumatologist regarding this shot.

## 2011-08-13 NOTE — Progress Notes (Signed)
Subjective:    Patient ID: Becky Adkins, female    DOB: 1925/01/22, 77 y.o.   MRN: [id number]  HPI CC: 1 mo f/u  77 yo with some dementia, 3v CAD treating medically with ASA, long acting nitrate and metoprolol, and PMR on daily 5mg  prednisone presents for 1 mo f/u.  Knee pain - resolved.  Seen last month after fall.  Doing well.  Tylenol PRN.  Sent to PT.  Not using any assistive ambulatory device.  Unsure what PT recommended regarding this.  Son will check.  Wt Readings from Last 3 Encounters:  08/13/11 159 lb 12 oz (72.462 kg)  07/22/11 160 lb 12 oz (72.916 kg)  07/13/11 159 lb 12.8 oz (72.485 kg)  weight up 40 lbs in 16 months.  Son worried about this.  Pt does not exercise.  Thinks could start walking more around [facility].  Osteoporosis - previously on boniva.  Discontinued after PUD and GI bleed 2010.  Currently not on Ca supplementation in h/o CAD.  Vit D checked last month was 41.  Sore throat - started today.  No fever, cough, congestion, RN, sneezing.  Tends to get, attributed to GERD in past.  On longterm protonix after PUD/GI bleed.  Dementia - last seen by memory clinic (was in longitudinal study) in 2008, never on dementia meds.  Son pulls me aside after visit with concerns of decline in function - weight gain, deteriorated oral hygiene (seeing dentist for this), deteriorated personal hygiene (cutting nails, etc).  Concern may need increased level of care.  Reviewed records from [facility], no evidence of pneumovax, zostavax or recent tetanus.  H/o shingles in past.  Son requests all three today.  However, discussed we would hold zostavax as pt on chronic prednisone 5mg .  To check with Rheum first.  Medications and allergies reviewed and updated in chart. Patient Active Problem List  Diagnoses  . CHEST PAIN UNSPECIFIED  . CAD (coronary artery disease)  . Hyperlipidemia  . Hypertension  . Polymyalgia rheumatica  . Dementia  . History of GI bleed  . Personal  history of TIA (transient ischemic attack)  . Macular degeneration  . GERD (gastroesophageal reflux disease)  . Hypothyroid  . Osteoporosis  . Depression  . Right knee pain  . Fall  . Renal insufficiency   Past Medical History  Diagnosis Date  . Hypertension   . Hyperlipidemia   . Polymyalgia rheumatica     on prednisone, has seen neuroophthalmologist, had normal temporal artery biopsy at dx  . History of GI bleed 12/2008    w/ submucosal gastric ulcers  . Dementia     mild, significant short term memory loss, eval by memory clinic and didn't think needed further meds  . Hiatal hernia   . Personal history of TIA (transient ischemic attack) 2009    neurology w/u with normal CT head, echo, carotid US  . Anemia     s/p hematology workup  . Macular degeneration     and cataracts  . Deafness     right ear  . GERD (gastroesophageal reflux disease)     severe on EGD with HH, H pylori neg  . Hypothyroid     s/p thyroidectomy  . Osteoporosis     was on boniva  . 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 Migraines     none recently  . Arthritis   . Depression   . Urinary incontinence  Past Surgical History  Procedure Date  . Thyroidectomy     for benign tumor  . Appendectomy   . Dilation and curettage of uterus   . Hospitalization 2007    global amnesia  . Hospitalization 12/2008    bleeding ulcer, asp PNA  . Hospitalization 2011    CP, r/o x 2 (neg cardiolite and ETT)  . Esophagogastroduodenoscopy 07/2009    LA grade C reflux esophagitis, HH, single gastric nodule ([place])  . Cholecystectomy   . Colonoscopy 2006    normal per previous records   History  Substance Use Topics  . Smoking status: Never Smoker   . Smokeless tobacco: Never Used  . Alcohol Use: Yes     Wine very rarely   Family History  Problem Relation Age of Onset  . Cancer Mother     cervical  . Coronary artery disease Father   . Alcohol abuse Father   .  Diabetes Paternal Aunt    Allergies  Allergen Reactions  . Codeine   . Penicillins    Current Outpatient Prescriptions on File Prior to Visit  Medication Sig Dispense Refill  . aspirin 81 MG EC tablet Take 81 mg by mouth daily.        Marland Kitchen atorvastatin (LIPITOR) 20 MG tablet Take 1 tablet (20 mg total) by mouth daily.  30 tablet  11  . citalopram (CELEXA) 20 MG tablet Take 30 mg by mouth daily. 1 1/2 tablets daily      . isosorbide mononitrate (IMDUR) 30 MG 24 hr tablet Take 1 tablet (30 mg total) by mouth 2 (two) times daily.  60 tablet  6  . levothyroxine (SYNTHROID, LEVOTHROID) 75 MCG tablet Take 1 tablet (75 mcg total) by mouth daily.  30 tablet  6  . losartan (COZAAR) 25 MG tablet Take 1 tablet (25 mg total) by mouth daily.  30 tablet  6  . metoprolol tartrate (LOPRESSOR) 25 MG tablet Take 25 mg by mouth 2 (two) times daily.        . Multiple Vitamins-Minerals (PRESERVISION AREDS 2 PO) Take 1 capsule by mouth 2 (two) times daily.        . nitroGLYCERIN (NITROSTAT) 0.4 MG SL tablet Place 0.4 mg under the tongue every 5 (five) minutes as needed.        . pantoprazole (PROTONIX) 40 MG tablet Take 40 mg by mouth daily.        . predniSONE (DELTASONE) 5 MG tablet Take 5 mg by mouth daily. For polymyalgia rhuematrica       . Acetaminophen 500 MG coapsule Take 1 capsule (500 mg total) by mouth 3 (three) times daily as needed for fever or pain.  30 capsule  0   Review of Systems Per HPI    Objective:   Physical Exam  Nursing note and vitals reviewed. Constitutional: She appears well-developed and well-nourished. No distress.  HENT:  Head: Normocephalic and atraumatic.  Mouth/Throat: Oropharynx is clear and moist. No oropharyngeal exudate.  Eyes: Conjunctivae and EOM are normal. Pupils are equal, round, and reactive to light. No scleral icterus.  Neck: Normal range of motion. Neck supple.  Cardiovascular: Normal rate, regular rhythm, normal heart sounds and intact distal pulses.   No  murmur heard. Pulmonary/Chest: Effort normal and breath sounds normal. No respiratory distress. She has no wheezes. She has no rales.  Musculoskeletal: Normal range of motion. She exhibits no edema.  Lymphadenopathy:    She has no cervical adenopathy.  Skin: Skin is  warm and dry. No rash noted.  Psychiatric: She has a normal mood and affect.          Assessment & Plan:

## 2011-08-15 ENCOUNTER — Telehealth: Payer: Self-pay | Admitting: Family Medicine

## 2011-08-15 ENCOUNTER — Encounter: Payer: Self-pay | Admitting: Family Medicine

## 2011-08-15 DIAGNOSIS — D631 Anemia in chronic kidney disease: Secondary | ICD-10-CM | POA: Insufficient documentation

## 2011-08-15 MED ORDER — LEVOTHYROXINE SODIUM 50 MCG PO TABS: 50.0000 ug | ORAL_TABLET | Freq: Every day | ORAL | Status: DC

## 2011-08-15 MED ORDER — DONEPEZIL HCL 5 MG PO TABS: 5.0000 mg | ORAL_TABLET | Freq: Every evening | ORAL | Status: DC | PRN

## 2011-08-15 NOTE — Assessment & Plan Note (Signed)
Mild. Monitor for now

## 2011-08-15 NOTE — Assessment & Plan Note (Signed)
Per son concern for decline in function. Trial of aricept 5mg . Discussed nausea/vomiting, gi upset.  To monitor for any improvement. If tolerating well and improvement noted, will increase to 10mg  daily.

## 2011-08-15 NOTE — Assessment & Plan Note (Signed)
New compared to last check 12/2009.  Also found to be hyperthyroid on replacement last check.  Seems to be staying well hydrated.  BP good control.  Recheck today.

## 2011-08-15 NOTE — Assessment & Plan Note (Signed)
Great control on current meds. ?

## 2011-08-15 NOTE — Assessment & Plan Note (Signed)
Monitor ST for now.  On protonix.

## 2011-08-15 NOTE — Telephone Encounter (Signed)
Please notify kidney function somewhat improved to 1.4 but still higher than where we'd like it to be.   Thyroid function still a bit high.  Recommend back down to synthroid daily.  New script sent in. Will recheck levels when returns in 4-6 wks.

## 2011-08-15 NOTE — Assessment & Plan Note (Signed)
Resolved

## 2011-08-15 NOTE — Assessment & Plan Note (Signed)
Stable on 5mg  prednisone. No visual sxs No AM stiffness, or weakness or muscle/joint pains.

## 2011-08-15 NOTE — Assessment & Plan Note (Signed)
Found high last visit, titrated synthroid accordingly.  recheck today.

## 2011-08-15 NOTE — Assessment & Plan Note (Addendum)
Changed from prava to lipitor 20mg  last month as LDL not at goal, pt tolerating change well. Recheck next fasting blood draw.

## 2011-08-15 NOTE — Assessment & Plan Note (Signed)
asked son to check with PT regarding any advice to help prevent falls in future.

## 2011-08-15 NOTE — Assessment & Plan Note (Addendum)
Previously oral Boniva. Not currently on bisphosphonate or Ca/Vit D.  (Vit D level 06/2011 was 41). No recent DEXA scan. Not likely treat with oral bisphosphonates 2/2 PUD/h/o severe GI bleed. Currently not candidate for IV bisphosphonate given GFR 30s, but if improved consider. Consider prolia (denosumab).   Discussed this with son.  Monitor kidney function for now. If decide to further treat, obtain DEXA first.

## 2011-08-15 NOTE — Assessment & Plan Note (Signed)
Medically managing.  No sxs.

## 2011-08-16 ENCOUNTER — Encounter: Payer: Self-pay | Admitting: Family Medicine

## 2011-08-16 NOTE — Telephone Encounter (Signed)
Message left notifying patient of results and of new medication dosage. Advised labs would be rechecked at her next visit. Instructed to call with any questions or concerns.

## 2011-08-26 DIAGNOSIS — M25569 Pain in unspecified knee: Secondary | ICD-10-CM

## 2011-08-26 DIAGNOSIS — F0390 Unspecified dementia without behavioral disturbance: Secondary | ICD-10-CM

## 2011-08-26 DIAGNOSIS — IMO0001 Reserved for inherently not codable concepts without codable children: Secondary | ICD-10-CM

## 2011-08-26 DIAGNOSIS — H3530 Unspecified macular degeneration: Secondary | ICD-10-CM

## 2011-08-26 DIAGNOSIS — M353 Polymyalgia rheumatica: Secondary | ICD-10-CM

## 2011-09-01 NOTE — Progress Notes (Signed)
   Patient ID: Becky Adkins, female    DOB: [birth year], 77 y.o.   MRN: [id number]  HPI Comments: Lab draw only     Review of Systems    Physical Exam

## 2011-10-19 ENCOUNTER — Telehealth: Payer: Self-pay | Admitting: *Deleted

## 2011-10-19 NOTE — Telephone Encounter (Signed)
Please have her stop aricept and update [place] in 1 wk.  To call us with questions in meantime.

## 2011-10-19 NOTE — Telephone Encounter (Signed)
Faxed order to [facility] to hold aricept x 1 week and call with an update. Message left notifying son of same. Advised to call me back if he had any other question or concerns in the meantime.

## 2011-10-19 NOTE — Telephone Encounter (Signed)
Pt was started on aricept at her last visit.  Son has been getting phone calls from the staff at [facility] because she has been agitated, verbally combative, depressed.  Daughter in law says this if very unusual for her, and they think the aricept is the reason.   She also take citalopram, which she has been on for several years, and which helps her.  Please advise.

## 2011-10-29 ENCOUNTER — Encounter: Payer: Self-pay | Admitting: Cardiovascular Disease

## 2011-11-02 ENCOUNTER — Ambulatory Visit: Payer: Medicare Other | Admitting: Cardiovascular Disease

## 2011-11-16 ENCOUNTER — Ambulatory Visit (INDEPENDENT_AMBULATORY_CARE_PROVIDER_SITE_OTHER): Payer: Medicare Other | Admitting: Cardiovascular Disease

## 2011-11-16 ENCOUNTER — Encounter: Payer: Self-pay | Admitting: Cardiovascular Disease

## 2011-11-16 VITALS — BP 120/68 | HR 61 | Ht 60.0 in | Wt 161.2 lb

## 2011-11-16 DIAGNOSIS — E785 Hyperlipidemia, unspecified: Secondary | ICD-10-CM

## 2011-11-16 DIAGNOSIS — I2510 Atherosclerotic heart disease of native coronary artery without angina pectoris: Secondary | ICD-10-CM

## 2011-11-16 DIAGNOSIS — I10 Essential (primary) hypertension: Secondary | ICD-10-CM

## 2011-11-16 NOTE — Assessment & Plan Note (Signed)
We have suggested we check her cholesterol in the next month at her convenience. Goal LDL less than 70.

## 2011-11-16 NOTE — Assessment & Plan Note (Signed)
Currently with no symptoms of angina. No further workup at this time. Continue current medication regimen. She has not needed nitroglycerin

## 2011-11-16 NOTE — Patient Instructions (Signed)
You are doing well. No medication changes were made.  We will check your cholesterol in December (just call anytime)  Please call us if you have new issues that need to be addressed before your next appt.  The office will contact you for a follow up Appt. In 6 months

## 2011-11-16 NOTE — Assessment & Plan Note (Signed)
Blood pressure is well controlled on today's visit. No changes made to the medications. 

## 2011-11-16 NOTE — Progress Notes (Signed)
Patient ID: Becky Adkins, female    DOB: 02/15/1925, 77 y.o.   MRN: [id number]  HPI Comments: Becky Adkins is 77 y/o woman (mother-in-law of Diane Moldovan) with multiple medical problems including HTN, hyperlipidemia, polymyalgia rheumatic, GIB with gastric ulcer 1/11 and short-term memory loss,  severe GIB in January 2011 due to gastric ulcer,  cath 12/11 at [facility] for chest pain,  showed severe 3-v CAD  Including LM 80% LAD 70% LCX 90% RCA 50-60%. Transferred to [facility] to discuss options regarding CABG vs LM stent. Saw Dr. Nolon Rod. Given comorbidities decide to treat medically after review with Tretha Sciara and Nathaniel Man. There was concern about sternal wound healing on chronic prednisone, concern about GI bleeding if a stent was placed.  Currently staying at [facility]. She denies any CP or pressure. Continues to have severe problems with her memory. Continues on prednisone for PMR.   Her son reports that overall she has been relatively stable. She has a stable gait, enjoys life at the nursing home and has not been requiring a high level of care. She walks up to 1/2 mile per day on average with no symptoms of shortness of breath or chest pain  EKG shows normal sinus rhythm with rate 61 beats per minute, rare PVC, no significant ST or T wave changes         Outpatient Encounter Prescriptions as of 11/16/2011  Medication Sig Dispense Refill  . Acetaminophen 500 MG coapsule Take 1 capsule (500 mg total) by mouth 3 (three) times daily as needed for fever or pain.  30 capsule  0  . aspirin 81 MG EC tablet Take 81 mg by mouth daily.        Marland Kitchen atorvastatin (LIPITOR) 20 MG tablet Take 1 tablet (20 mg total) by mouth daily.  30 tablet  11  . citalopram (CELEXA) 20 MG tablet Take 30 mg by mouth daily. 1 1/2 tablets daily      . isosorbide mononitrate (IMDUR) 30 MG 24 hr tablet Take 1 tablet (30 mg total) by mouth 2 (two) times daily.  60 tablet  6  . levothyroxine (SYNTHROID, LEVOTHROID)  50 MCG tablet Take 1 tablet (50 mcg total) by mouth daily.  30 tablet  6  . losartan (COZAAR) 25 MG tablet Take 1 tablet (25 mg total) by mouth daily.  30 tablet  6  . metoprolol tartrate (LOPRESSOR) 25 MG tablet Take 25 mg by mouth 2 (two) times daily.        . Multiple Vitamins-Minerals (PRESERVISION AREDS 2 PO) Take 1 capsule by mouth 2 (two) times daily.        . nitroGLYCERIN (NITROSTAT) 0.4 MG SL tablet Place 0.4 mg under the tongue every 5 (five) minutes as needed.        . pantoprazole (PROTONIX) 40 MG tablet Take 40 mg by mouth daily.        . predniSONE (DELTASONE) 5 MG tablet Take 5 mg by mouth daily. For polymyalgia rhuematrica          Review of Systems  Constitutional: Negative.   HENT: Negative.   Eyes: Negative.   Respiratory: Negative.   Cardiovascular: Negative.   Gastrointestinal: Negative.   Musculoskeletal: Negative.   Skin: Negative.   Neurological: Negative.        Memory problems  Hematological: Negative.   Psychiatric/Behavioral: Negative.   All other systems reviewed and are negative.    BP 120/68  Pulse 61  Ht 5' (1.524 m)  Wt 161 lb 4 oz (73.143 kg)  BMI 31.49 kg/m2   Physical Exam  Nursing note and vitals reviewed. Constitutional: She is oriented to person, place, and time. She appears well-developed and well-nourished.  HENT:  Head: Normocephalic.  Nose: Nose normal.  Mouth/Throat: Oropharynx is clear and moist.  Eyes: Conjunctivae are normal. Pupils are equal, round, and reactive to light.  Neck: Normal range of motion. Neck supple. No JVD present.  Cardiovascular: Normal rate, regular rhythm, S1 normal, S2 normal, normal heart sounds and intact distal pulses.  Exam reveals no gallop and no friction rub.   No murmur heard. Pulmonary/Chest: Effort normal and breath sounds normal. No respiratory distress. She has no wheezes. She has no rales. She exhibits no tenderness.  Abdominal: Soft. Bowel sounds are normal. She exhibits no distension.  There is no tenderness.  Musculoskeletal: Normal range of motion. She exhibits no edema and no tenderness.  Lymphadenopathy:    She has no cervical adenopathy.  Neurological: She is alert and oriented to person, place, and time. Coordination normal.  Skin: Skin is warm and dry. No rash noted. No erythema.  Psychiatric: Her behavior is normal. Thought content normal.       Poor memory  Assessment and Plan

## 2011-11-17 ENCOUNTER — Other Ambulatory Visit: Payer: Self-pay | Admitting: Cardiovascular Disease

## 2011-11-17 DIAGNOSIS — E785 Hyperlipidemia, unspecified: Secondary | ICD-10-CM

## 2012-01-05 ENCOUNTER — Telehealth: Payer: Self-pay | Admitting: Family Medicine

## 2012-01-05 DIAGNOSIS — E039 Hypothyroidism, unspecified: Secondary | ICD-10-CM

## 2012-01-05 DIAGNOSIS — N289 Disorder of kidney and ureter, unspecified: Secondary | ICD-10-CM

## 2012-01-05 DIAGNOSIS — E785 Hyperlipidemia, unspecified: Secondary | ICD-10-CM

## 2012-01-05 NOTE — Telephone Encounter (Signed)
plz notify son pt is due for blood work - cholesterol levels from cards and thyroid check from me.  Have placed in chart.  May come at her convenience fasting for blood work here or to cards.

## 2012-01-06 NOTE — Telephone Encounter (Signed)
Message left notifying patient's son to schedule fasting labs for patient. Advised they could be done here or cardiology, whichever is more convenient. Advised to call with any questions or concerns.

## 2012-01-10 ENCOUNTER — Other Ambulatory Visit: Payer: Medicare Other

## 2012-01-12 ENCOUNTER — Telehealth: Payer: Self-pay

## 2012-01-12 MED ORDER — LOSARTAN POTASSIUM 25 MG PO TABS: 25.0000 mg | ORAL_TABLET | Freq: Every day | ORAL | Status: DC

## 2012-01-12 NOTE — Telephone Encounter (Signed)
Refill sent for losartan potassium 25 mg

## 2012-01-13 ENCOUNTER — Other Ambulatory Visit (INDEPENDENT_AMBULATORY_CARE_PROVIDER_SITE_OTHER): Payer: Medicare Other

## 2012-01-13 DIAGNOSIS — N289 Disorder of kidney and ureter, unspecified: Secondary | ICD-10-CM | POA: Diagnosis not present

## 2012-01-13 DIAGNOSIS — E785 Hyperlipidemia, unspecified: Secondary | ICD-10-CM

## 2012-01-13 DIAGNOSIS — E039 Hypothyroidism, unspecified: Secondary | ICD-10-CM

## 2012-01-13 LAB — HEPATIC FUNCTION PANEL
Bilirubin, Direct: 0 mg/dL (ref 0.0–0.3)
Total Bilirubin: 0.3 mg/dL (ref 0.3–1.2)
Total Protein: 7.1 g/dL (ref 6.0–8.3)

## 2012-01-13 LAB — BASIC METABOLIC PANEL
CO2: 24 mEq/L (ref 19–32)
Calcium: 8.9 mg/dL (ref 8.4–10.5)
Creatinine, Ser: 1.6 mg/dL — ABNORMAL HIGH (ref 0.4–1.2)
GFR: 32.4 mL/min — ABNORMAL LOW (ref >60.00)

## 2012-01-13 LAB — T4, FREE: Free T4: 0.76 ng/dL (ref 0.60–1.60)

## 2012-01-13 LAB — LIPID PANEL
HDL: 48.6 mg/dL (ref >39.00)
Total CHOL/HDL Ratio: 4
Triglycerides: 137 mg/dL (ref 0.0–149.0)

## 2012-01-13 LAB — TSH: TSH: 15.35 u[IU]/mL — ABNORMAL HIGH (ref 0.35–5.50)

## 2012-01-13 NOTE — Progress Notes (Signed)
Addended by: Alvina Chou on: 01/13/2012 10:56 AM   Modules accepted: Orders

## 2012-01-14 ENCOUNTER — Encounter: Payer: Self-pay | Admitting: Family Medicine

## 2012-01-14 ENCOUNTER — Telehealth: Payer: Self-pay | Admitting: Family Medicine

## 2012-01-14 ENCOUNTER — Ambulatory Visit (INDEPENDENT_AMBULATORY_CARE_PROVIDER_SITE_OTHER): Payer: Medicare Other | Admitting: Family Medicine

## 2012-01-14 VITALS — BP 140/70 | HR 72 | Temp 97.9°F | Wt 167.0 lb

## 2012-01-14 DIAGNOSIS — E039 Hypothyroidism, unspecified: Secondary | ICD-10-CM | POA: Diagnosis not present

## 2012-01-14 DIAGNOSIS — I10 Essential (primary) hypertension: Secondary | ICD-10-CM

## 2012-01-14 DIAGNOSIS — M810 Age-related osteoporosis without current pathological fracture: Secondary | ICD-10-CM

## 2012-01-14 DIAGNOSIS — D649 Anemia, unspecified: Secondary | ICD-10-CM

## 2012-01-14 DIAGNOSIS — M353 Polymyalgia rheumatica: Secondary | ICD-10-CM | POA: Diagnosis not present

## 2012-01-14 DIAGNOSIS — E785 Hyperlipidemia, unspecified: Secondary | ICD-10-CM

## 2012-01-14 DIAGNOSIS — N289 Disorder of kidney and ureter, unspecified: Secondary | ICD-10-CM

## 2012-01-14 MED ORDER — LEVOTHYROXINE SODIUM 50 MCG PO TABS: ORAL_TABLET | ORAL | Status: DC

## 2012-01-14 NOTE — Telephone Encounter (Signed)
Message left notifying patient's son. Advised to call and let me know if she had stopped the aricept and to schedule repeat labs and wellness visit.

## 2012-01-14 NOTE — Patient Instructions (Addendum)
Check on shingles vaccine. More regular blood pressure checks for next week and send me records of numbers. Blood work today to check ESR and CBC (anemia and PMR). Increase synthroid to 2 pills on Sunday, 1 pill every other day. Return to see me in 1 month for follow up. If cough returning, let me know sooner.

## 2012-01-14 NOTE — Progress Notes (Signed)
Subjective:    Patient ID: Becky Adkins, female    DOB: 09/07/1925, 78 y.o.   MRN: [id number]  HPI CC: elevated BPs  Pleasant 78 yo with some dementia, 3v CAD treating medically with ASA, long acting nitrate and metoprolol, and PMR on daily 5mg  prednisone presents for 1 mo f/u.  Presents with daughter in law, [place] Muta Habibi as walk in from ALF today - accidentally made appt at prior PCP's office.  This morning at ALF blood pressure spiked to 220/100s.  This is second time this has happened.  Both times complaining of feeling lightheaded.  First time several months back.  Daughter in law worried because h/o CAD.  Blood pressure was rechecked every 4 hours throughout the day and slowly returned to normal.  Seems to have normal blood pressures on other days.  Currently with frontal and vertex headache "burning sensation".  Feels like heaviness, dull.  Also had mild cough for 10 days, today feeling better from cough.  No vision changes, no stroke sxs.  Never fevers/chills, shortness of breath, chest pain or tightness.  Has never had angina sxs, anginal equivalent was back pain.  Not currently with back pain.  Staying tired, fatigued.  H/o anemia, h/o PMR.  Reviewed recent blood work with daughter in law - thyroid currently undertreated with TSH 15.  Cr stable but elevated at 1.6.  H/o shingles in past. Diane will check into if zostavax given at prior PCPs.  She would prefer Claris Che receive given bad case of shingles in past.  However pt on chronic prednisone 5mg , may recommend check with Rheum first.  Per prior notes:  Dementia - ongoing for several decades, has seen memory clinic at [facility], last seen 2008, never formal diagnosis. Enrolled in study. More of an issue with short term memory. Tried aricept, didn't tolerate or really help. On celexa 30mg . Osteoporosis - previously on boniva. Discontinued after PUD and GI bleed 2010. Currently not on Ca supplementation in h/o 3v CAD.  Vit D checked last was 41. Past Medical History  Diagnosis Date  . Hypertension   . Hyperlipidemia   . Polymyalgia rheumatica     on prednisone, has seen neuroophthalmologist, had normal temporal artery biopsy at dx  . History of GI bleed 12/2008    w/ submucosal gastric ulcers  . Dementia     mild, significant short term memory loss, eval by memory clinic and didn't think needed further meds  . Hiatal hernia   . Personal history of TIA (transient ischemic attack) 2009    neurology w/u with normal CT head, echo, carotid US  . Anemia     s/p hematology workup, h/o B12 shots  . Macular degeneration     and cataracts  . Deafness     right ear  . GERD (gastroesophageal reflux disease)     severe on EGD with HH, H pylori neg  . Hypothyroid     s/p thyroidectomy  . Osteoporosis     was on boniva  . 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 Migraines     none recently  . Arthritis   . Depression   . Urinary incontinence     Review of Systems Per HPI    Objective:   Physical Exam  Nursing note and vitals reviewed. Constitutional: She appears well-developed and well-nourished. No distress.  HENT:  Head: Normocephalic and atraumatic.  Right Ear: Hearing, tympanic membrane, external ear and ear canal  normal.  Left Ear: Hearing, tympanic membrane, external ear and ear canal normal.  Nose: No mucosal edema or rhinorrhea. Right sinus exhibits no maxillary sinus tenderness and no frontal sinus tenderness. Left sinus exhibits no maxillary sinus tenderness and no frontal sinus tenderness.  Mouth/Throat: Uvula is midline, oropharynx is clear and moist and mucous membranes are normal. No oropharyngeal exudate, posterior oropharyngeal edema, posterior oropharyngeal erythema or tonsillar abscesses.       No temporal pain. No tenderness to palpation of scalp  Eyes: Conjunctivae and EOM are normal. Pupils are equal, round, and reactive to light. No scleral  icterus.  Neck: Normal range of motion. Neck supple.  Cardiovascular: Normal rate, regular rhythm, normal heart sounds and intact distal pulses.   No murmur heard. Pulmonary/Chest: Effort normal. No respiratory distress. She has no wheezes. She has rhonchi (LLL). She has no rales.  Musculoskeletal: Normal range of motion. She exhibits no edema.  Lymphadenopathy:    She has no cervical adenopathy.  Neurological: She has normal strength. No cranial nerve deficit or sensory deficit. Coordination normal.  Skin: Skin is warm and dry. No rash noted.       No rash on scalp  Psychiatric: She has a normal mood and affect.       Assessment & Plan:

## 2012-01-14 NOTE — Telephone Encounter (Signed)
Please notify sugar, liver normal, cholesterol levels improved.   Thyroid function returned a bit low now.   Would like to increase synthroid to 2 pills on Sunday, one pill every other day.   I do want her to return in 6 weeks for recheck though (didn't last time). Check to see that she has stopped aricept. I would also like her to come in for a wellness visit when she can.

## 2012-01-15 ENCOUNTER — Encounter: Payer: Self-pay | Admitting: Family Medicine

## 2012-01-15 LAB — SEDIMENTATION RATE: Sed Rate: 91 mm/hr — ABNORMAL HIGH (ref 0–22)

## 2012-01-15 LAB — CBC WITH DIFFERENTIAL/PLATELET
Eosinophils Absolute: 0.1 10*3/uL (ref 0.0–0.7)
HCT: 32.7 % — ABNORMAL LOW (ref 36.0–46.0)
MCHC: 31.2 g/dL (ref 30.0–36.0)
MCV: 98.5 fL (ref 78.0–100.0)
Monocytes Relative: 10 % (ref 3–12)
RDW: 14.3 % (ref 11.5–15.5)

## 2012-01-15 NOTE — Assessment & Plan Note (Signed)
Increase synthroid by weekly, recheck levels in 1 month.

## 2012-01-15 NOTE — Assessment & Plan Note (Signed)
Last several creatinines elevated. This week Cr 1.6. 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  Seems to be staying hydrated, knows to avoid NSAIDs. On ARB.  Continue to monitor.  check UPr/Cr ratio next visit.

## 2012-01-15 NOTE — Assessment & Plan Note (Signed)
Reviewed cholesterol levels.  Goal <70.  Will recommend increased lipitor to 40mg  next visit in 1 mo.

## 2012-01-15 NOTE — Assessment & Plan Note (Signed)
Not candidate for bisphosphonate anymore 2/2 Cr. Not on calcium 2/2 CAD. Vit D level 06/2011 41. No recent dexa scan.   Would be candidate for prolia.   On chronic prednisone. If son decides to treat, obtain dexa, then start prolia.

## 2012-01-15 NOTE — Assessment & Plan Note (Signed)
Recent spikes in blood pressure with lightheadedness/HA. Not likely due to CAD. Undertreated hypothyroidism could be contributing 2/2 elevated PVR, however would expect more chronically elevated BP. Have asked ALF to check vitals TID to QID for next week and send me log (more data points).  Then will see if needs to adjust antihyertensives. Increase synthroid and recheck TSH in 4-6 wks. Given associated fatigue and HA, check CBC to r/o worsening anemia as cause of this, recheck ESR to monitor PMR.  Last low 100s.

## 2012-01-15 NOTE — Assessment & Plan Note (Signed)
Chronic.  Check ESR today.  Continue 5mg  prednisone

## 2012-01-18 ENCOUNTER — Telehealth: Payer: Self-pay | Admitting: Family Medicine

## 2012-01-18 NOTE — Telephone Encounter (Signed)
Faxed order to discontinued Aricept 5mg  to [facility] 01-18-2012.  Called [facility] and spoke w/ Missy....[facility]

## 2012-01-18 NOTE — Telephone Encounter (Signed)
Missy w/ [facility] called, need to know if they need to discontinue Aricept medication. Per the notes from last office visit. Please call Missy- Med Tech 1st floor -- at [phone].

## 2012-01-18 NOTE — Telephone Encounter (Signed)
Noted. Thanks.

## 2012-01-24 ENCOUNTER — Telehealth: Payer: Self-pay

## 2012-01-24 MED ORDER — ISOSORBIDE MONONITRATE ER 30 MG PO TB24: 30.0000 mg | ORAL_TABLET | Freq: Two times a day (BID) | ORAL | Status: DC

## 2012-01-24 NOTE — Telephone Encounter (Signed)
Refill sent for isosorbide MN ER 30 mg take one tablet twice a day.

## 2012-01-31 ENCOUNTER — Other Ambulatory Visit: Payer: Self-pay | Admitting: *Deleted

## 2012-01-31 MED ORDER — ISOSORBIDE MONONITRATE ER 30 MG PO TB24: 30.0000 mg | ORAL_TABLET | Freq: Two times a day (BID) | ORAL | Status: DC

## 2012-02-04 ENCOUNTER — Telehealth: Payer: Self-pay

## 2012-02-04 NOTE — Telephone Encounter (Signed)
Spoke to [employer] with [employer] @ [phone] for refills on losartan potassium 25 mg take one tablet daily with isosorbide mono 30 mg take one tablet twice a day.

## 2012-02-14 ENCOUNTER — Telehealth: Payer: Self-pay | Admitting: *Deleted

## 2012-02-14 NOTE — Telephone Encounter (Signed)
Never received log from [facility].

## 2012-02-14 NOTE — Telephone Encounter (Signed)
Also, did you get records of BP checks from [facility]?

## 2012-02-14 NOTE — Telephone Encounter (Signed)
Spoke with Becky Adkins.  She and husband have HCPOA.  dementia declining slowly.  Has had some concerns expressed by facility - becoming assertive/?aggressive with other residents.  Also getting more forgetful.  Has been on celexa 30mg  daily, this was significant improvement when started.  Daughter wanted to discuss any warranted change in medication.  Will further discuss at f/u.

## 2012-02-14 NOTE — Telephone Encounter (Signed)
Patient's daughter-in-law is called stating that patient has an upcoming appointment this week and she would like to discuss with you in private some problems that they are having. Diane states that the patient is having problems with agitation and aggressive behavior at times at the facility where she is staying. They also have concerns with her level of awareness. Diane states that she and her husband have Midwife for the patient.

## 2012-02-16 ENCOUNTER — Other Ambulatory Visit: Payer: Self-pay | Admitting: Family Medicine

## 2012-02-16 ENCOUNTER — Encounter: Payer: Self-pay | Admitting: Family Medicine

## 2012-02-16 ENCOUNTER — Ambulatory Visit (INDEPENDENT_AMBULATORY_CARE_PROVIDER_SITE_OTHER): Payer: Medicare Other | Admitting: Family Medicine

## 2012-02-16 VITALS — BP 142/80 | HR 64 | Temp 98.3°F | Wt 164.8 lb

## 2012-02-16 DIAGNOSIS — M810 Age-related osteoporosis without current pathological fracture: Secondary | ICD-10-CM

## 2012-02-16 DIAGNOSIS — M353 Polymyalgia rheumatica: Secondary | ICD-10-CM

## 2012-02-16 DIAGNOSIS — E039 Hypothyroidism, unspecified: Secondary | ICD-10-CM | POA: Diagnosis not present

## 2012-02-16 DIAGNOSIS — N289 Disorder of kidney and ureter, unspecified: Secondary | ICD-10-CM

## 2012-02-16 DIAGNOSIS — F0390 Unspecified dementia without behavioral disturbance: Secondary | ICD-10-CM | POA: Diagnosis not present

## 2012-02-16 DIAGNOSIS — D649 Anemia, unspecified: Secondary | ICD-10-CM | POA: Diagnosis not present

## 2012-02-16 DIAGNOSIS — E538 Deficiency of other specified B group vitamins: Secondary | ICD-10-CM | POA: Diagnosis not present

## 2012-02-16 DIAGNOSIS — F329 Major depressive disorder, single episode, unspecified: Secondary | ICD-10-CM

## 2012-02-16 DIAGNOSIS — I10 Essential (primary) hypertension: Secondary | ICD-10-CM

## 2012-02-16 LAB — IBC PANEL
Iron: 57 ug/dL (ref 42–145)
Transferrin: 258.1 mg/dL (ref 212.0–360.0)

## 2012-02-16 LAB — VITAMIN B12: Vitamin B-12: 240 pg/mL (ref 211–911)

## 2012-02-16 LAB — FERRITIN: Ferritin: 25.8 ng/mL (ref 10.0–291.0)

## 2012-02-16 MED ORDER — QUETIAPINE 12.5 MG HALF TABLET: 12.5000 mg | ORAL_TABLET | Freq: Every day | ORAL | Status: DC

## 2012-02-16 MED ORDER — LEVOTHYROXINE SODIUM 50 MCG PO TABS: ORAL_TABLET | ORAL | Status: DC

## 2012-02-16 NOTE — Progress Notes (Addendum)
Subjective:    Patient ID: Becky Adkins, female    DOB: January 25, 1925, 78 y.o.   MRN: [id number]  HPI CC: 1 mo f/u  Pleasant 78 yo with h/o dementia, severe 3v CAD treating medically with ASA, long acting nitrate and metoprolol, and PMR on daily 5mg  prednisone presents for 1 mo f/u.  Presents with daughter in law, Becky Adkins.  Blood work reviewed.  Concern for increasing agitation at [facility] where she lives - has been more assertive with other residents recently, needs cues to change clothing, at times resistant to changing clothes.  Pt is not aware of this change.  Also some more anxiety endorsed - has asked for room closer to nursing station.  Prior started on aricept in fall 2012, concern for worsening agitation so this was discontinued in January.  Daughter feels otherwise doing well at ALF.  Hesitant to increase level of care.  HTN - daughter took a look at blood pressure at [facility] - 140/80 range.  No HA, vision changes, CP/tightness, SOB, leg swelling.    ==>ADDENDUM: received log of blood pressures at [facility], asked to scan.  Good range overall - 110-140/60-80  hypothyroid - due for recheck today as changed synthroid dose last month 2/2 TSH returning at 15.  Anemia - s/p workup by hematology unrevealing per daughter in law.  Bring copy of latest ferritin, B12 notes.  S/p B12 shots.  Renal insufficiency - back in 2011 Cr 1.1, since I've been following her Cr has been elevated at ~1.6.  Discussed with daughter.  Dementia - ongoing for several decades, has seen memory clinic at [facility], last seen 2008, never formal diagnosis. Enrolled in study. More of an issue with short term memory. Tried aricept, didn't tolerate or really help. On celexa 30mg .   Osteoporosis - previously on boniva. Discontinued after PUD and GI bleed 2010. Currently not on Ca supplementation in h/o 3v CAD. Vit D checked last was 41 (06/2011).  H/o shingles in past. Bring copy of record showing  shingles vaccine given.  This was input into chart.  Wt Readings from Last 3 Encounters:  02/16/12 164 lb 12 oz (74.73 kg)  01/14/12 167 lb (75.751 kg)  11/16/11 161 lb 4 oz (73.143 kg)    BP Readings from Last 3 Encounters:  02/16/12 142/80  01/14/12 140/70  11/16/11 120/68    Review of Systems Denies recent fevers/chills, abd pain, chest pain, SOB, cough, morning stiffness, shoulder or hip girdle pain, vision changes.  O/w per HPI    Objective:   Physical Exam  Nursing note and vitals reviewed. Constitutional: She appears well-developed and well-nourished. No distress.  HENT:  Head: Normocephalic and atraumatic.  Mouth/Throat: Oropharynx is clear and moist. No oropharyngeal exudate.  Eyes: Conjunctivae and EOM are normal. Pupils are equal, round, and reactive to light. No scleral icterus.  Neck: Normal range of motion. Neck supple.  Cardiovascular: Normal rate, regular rhythm, normal heart sounds and intact distal pulses.   No murmur heard. Pulmonary/Chest: Effort normal and breath sounds normal. No respiratory distress. She has no wheezes. She has no rales.       LLL crackles  Musculoskeletal: She exhibits no edema.  Lymphadenopathy:    She has no cervical adenopathy.  Neurological: She is alert.       Impaired recent memory  Skin: Skin is warm and dry. No rash noted. There is pallor.  Psychiatric: She has a normal mood and affect.  Assessment & Plan:

## 2012-02-16 NOTE — Assessment & Plan Note (Signed)
On continued steroids.  Consider prolia. No recent dexa.  Not on Ca 2/2 CAD.  Last vit D normal 2012.

## 2012-02-16 NOTE — Assessment & Plan Note (Addendum)
Consider slow titration off prednisone at max rate 1mg /mo No am stiffness.  No vision changes.

## 2012-02-16 NOTE — Assessment & Plan Note (Signed)
Recheck today.  Titrate accordingly.

## 2012-02-16 NOTE — Patient Instructions (Signed)
Good to see you today. We will start seroquel 12.5mg  daily - will need to watch weight on this. Blood work today.  We have also checked urine today. Call us with questions. Return in 1 month for follow up.

## 2012-02-16 NOTE — Assessment & Plan Note (Signed)
Stable on celexa 30mg  daily.  Would not increase this dose.

## 2012-02-16 NOTE — Assessment & Plan Note (Addendum)
Discussed new goal given CKD. I have not received log of BPs from [facility].  Diane will request they fax me log.  If consistently >!30/80, increase cozaar.  ==>ADDENDUM: did receive and review log of blood pressures showing overall good control, no changes needed.

## 2012-02-16 NOTE — Assessment & Plan Note (Signed)
Continued concern for slow decline Family hesitant to request higher level of care because they feel Becky Adkins significantly benefits from current level of socialization.  Very reasonable. Discussed options.  Does not seem like depression contributing. Discussed seroquel for agitation nightly.  Discussed monitoring weight, discussed possible increase mortality risk associated with atypical antipsychotics.  Daughter in law Becky Adkins) feels comfortable with this. No evidence of infection to cause worsening.  Pt unable to provide urine for UA but denies any LUTS. Have tried aricept in past, did not respond well.

## 2012-02-16 NOTE — Assessment & Plan Note (Signed)
Lab Results  Component Value Date   HGB 10.2* 01/14/2012  deteriorated compared to last check (11.8).  Check ferritin and B12 today.  H/o B12 deficiency in past.

## 2012-02-16 NOTE — Assessment & Plan Note (Signed)
Discussed diagnosis.  Knows to avoid NSAIDs, stay hydrated. Consider SPEP UPEP.

## 2012-02-17 ENCOUNTER — Telehealth: Payer: Self-pay | Admitting: *Deleted

## 2012-02-17 NOTE — Telephone Encounter (Signed)
i see.  Please offer Becky Adkins option of daily B12 supplement ( ) which isn't as well absorbed but would avoid monthly shot for Becky Adkins.  I'm ok with either option.

## 2012-02-17 NOTE — Telephone Encounter (Signed)
Patient's daughter in law called and left message that they understood changes. She wanted to let you know that the patient has gone through 2 separate rounds of the B12 injections and never had any significant change in her levels. They are very willing to do them again, but just wanted you to be aware that they haven't worked in the past.

## 2012-02-18 NOTE — Telephone Encounter (Signed)
Daughter in law says they will try the b-12 injectons  for now but will need an order home health and the b-12 injection with rx

## 2012-02-18 NOTE — Telephone Encounter (Signed)
Left message for patients daughter in law to return my call

## 2012-02-18 NOTE — Telephone Encounter (Signed)
I think Selena Batten took care of this.

## 2012-02-21 ENCOUNTER — Telehealth: Payer: Self-pay | Admitting: *Deleted

## 2012-02-21 NOTE — Telephone Encounter (Signed)
Clarified with Marylene Land and clarification order faxed as requested.

## 2012-02-21 NOTE — Telephone Encounter (Signed)
Please clarify as per isntructions - one daily ( ), extra pill on wednesdays and sundays ( ).

## 2012-02-21 NOTE — Telephone Encounter (Signed)
Marylene Land called needing to verify dose of Synthroid.  She wants to know if Dr. Sharen Hones wants patient to continue to alternate doses.  Please advise.

## 2012-02-25 NOTE — Telephone Encounter (Signed)
I did. Everything was faxed to [facility].

## 2012-03-22 ENCOUNTER — Ambulatory Visit: Payer: Medicare Other | Admitting: Family Medicine

## 2012-03-29 ENCOUNTER — Ambulatory Visit (INDEPENDENT_AMBULATORY_CARE_PROVIDER_SITE_OTHER): Payer: Medicare Other | Admitting: Family Medicine

## 2012-03-29 ENCOUNTER — Encounter: Payer: Self-pay | Admitting: Family Medicine

## 2012-03-29 VITALS — BP 162/80 | HR 70 | Temp 98.1°F | Wt 167.0 lb

## 2012-03-29 DIAGNOSIS — E039 Hypothyroidism, unspecified: Secondary | ICD-10-CM

## 2012-03-29 DIAGNOSIS — F0390 Unspecified dementia without behavioral disturbance: Secondary | ICD-10-CM

## 2012-03-29 DIAGNOSIS — I10 Essential (primary) hypertension: Secondary | ICD-10-CM

## 2012-03-29 DIAGNOSIS — D649 Anemia, unspecified: Secondary | ICD-10-CM | POA: Diagnosis not present

## 2012-03-29 DIAGNOSIS — M810 Age-related osteoporosis without current pathological fracture: Secondary | ICD-10-CM

## 2012-03-29 DIAGNOSIS — K219 Gastro-esophageal reflux disease without esophagitis: Secondary | ICD-10-CM

## 2012-03-29 DIAGNOSIS — N183 Chronic kidney disease, stage 3 unspecified: Secondary | ICD-10-CM

## 2012-03-29 DIAGNOSIS — R05 Cough: Secondary | ICD-10-CM | POA: Insufficient documentation

## 2012-03-29 DIAGNOSIS — R059 Cough, unspecified: Secondary | ICD-10-CM | POA: Insufficient documentation

## 2012-03-29 LAB — CBC WITH DIFFERENTIAL/PLATELET
Basophils Absolute: 0 10*3/uL (ref 0.0–0.1)
Eosinophils Absolute: 0.2 10*3/uL (ref 0.0–0.7)
HCT: 29.8 % — ABNORMAL LOW (ref 36.0–46.0)
Lymphs Abs: 1.2 10*3/uL (ref 0.7–4.0)
MCV: 95.5 fl (ref 78.0–100.0)
Monocytes Absolute: 0.6 10*3/uL (ref 0.1–1.0)
Neutrophils Relative %: 60.3 % (ref 43.0–77.0)
Platelets: 223 10*3/uL (ref 150.0–400.0)
RDW: 14.9 % — ABNORMAL HIGH (ref 11.5–14.6)

## 2012-03-29 LAB — BASIC METABOLIC PANEL
BUN: 24 mg/dL — ABNORMAL HIGH (ref 6–23)
CO2: 25 mEq/L (ref 19–32)
Calcium: 9 mg/dL (ref 8.4–10.5)
Chloride: 105 mEq/L (ref 96–112)
Creatinine, Ser: 1.4 mg/dL — ABNORMAL HIGH (ref 0.4–1.2)

## 2012-03-29 LAB — MICROALBUMIN / CREATININE URINE RATIO
Creatinine,U: 97 mg/dL
Microalb, Ur: 0.8 mg/dL (ref 0.0–1.9)

## 2012-03-29 MED ORDER — LOSARTAN POTASSIUM 50 MG PO TABS: 50.0000 mg | ORAL_TABLET | Freq: Every day | ORAL | Status: DC

## 2012-03-29 NOTE — Assessment & Plan Note (Signed)
Stop seroquel 2/2 side effects. Monitor for now.  Consider adjuvant antidepressant vs increasing celexa to 40mg  daily.

## 2012-03-29 NOTE — Patient Instructions (Addendum)
I think seroquel may have caused many of these issues (rash, leg swelling, increased blood pressure) so stop medicine. I've asked [facility] to keep eye on reflux symptoms to see if we need to change around protonix. Blood work and urine checked today. Increase cozaar to 50mg  daily. Good to see you today, call us with questions. Return in 1-2 months for follow up.

## 2012-03-29 NOTE — Assessment & Plan Note (Signed)
Prior on boniva, stopped 2/2 GI bleed and h/o gastric ulcers. Will need to discuss prolia.

## 2012-03-29 NOTE — Assessment & Plan Note (Signed)
Last Hgb 10.2, recheck today. S/p w/u unrevealing in past.

## 2012-03-29 NOTE — Assessment & Plan Note (Signed)
SPEP/UPEP today. Recheck Cr today as well.

## 2012-03-29 NOTE — Assessment & Plan Note (Signed)
Borderline to elevated last several visits Increase cozaar today to 50mg  daily for better BP control.

## 2012-03-29 NOTE — Assessment & Plan Note (Signed)
No evidence of lung infection today or of CHF exac. Monitor for now.  ?from worsening GERD. Baseline LLL crackles. No recent CXR. Normal ambulatory pulse ox today.

## 2012-03-29 NOTE — Assessment & Plan Note (Signed)
Endorsing some increase in sxs. Will ask staff to monitor for increase in GERD sxs, if that is the case, consider increasing PPI to BID dosing.

## 2012-03-29 NOTE — Progress Notes (Signed)
  Subjective:    Patient ID: Becky Adkins, female    DOB: Dec 30, 1924, 78 y.o.   MRN: [id number]  HPI CC: 1 mo f/u  Pleasant 78 yo with h/o dementia, severe 3v CAD treating medically with ASA, long acting nitrate and metoprolol, and PMR on daily 5mg  prednisone presents for 1 mo f/u. Presents with son.  Started seroquel last month for some agitation associated with dementia.  New leg swelling - worse over weekend.  Also noted rash around both ankles.  Cough noted recently off and on for last month.  Some SOB with walking to car on Sunday, recovered quickly.  No fevers/chills.    Cough sounds wet but hasn't really produced any sputum.  Denies dizziness or HA.  Some trouble sleeping at night.  Some GERD sxs over weekend.  Started B12 monthly shots for low normal B12 (240) last check.  BP Readings from Last 3 Encounters:  03/29/12 162/80  02/16/12 142/80  01/14/12 140/70   Wt Readings from Last 3 Encounters:  03/29/12 167 lb (75.751 kg)  02/16/12 164 lb 12 oz (74.73 kg)  01/14/12 167 lb (75.751 kg)     Review of Systems Per HPI    Objective:   Physical Exam  Nursing note and vitals reviewed. Constitutional: She appears well-developed and well-nourished. No distress.  HENT:  Head: Normocephalic and atraumatic.  Mouth/Throat: Oropharynx is clear and moist. No oropharyngeal exudate.  Eyes: Conjunctivae and EOM are normal. Pupils are equal, round, and reactive to light. No scleral icterus.  Neck: Normal range of motion. Neck supple. No JVD present. Carotid bruit is not present.  Cardiovascular: Normal rate, regular rhythm, normal heart sounds and intact distal pulses.   No murmur heard. Pulmonary/Chest: Effort normal and breath sounds normal. No respiratory distress. She has no wheezes. She has no rales.       Crackles LLL  Abdominal: Soft.       No abd/renal bruit  Musculoskeletal: She exhibits edema (1+ pitting edema bilaterally).  Lymphadenopathy:    She has  no cervical adenopathy.  Skin: Skin is warm and dry. Rash (nonblanching macular rash bilateral lower legs, slightly tender) noted.  Psychiatric: She has a normal mood and affect.      Assessment & Plan:

## 2012-03-31 LAB — PROTEIN ELECTROPHORESIS, SERUM
Beta 2: 5.6 % (ref 3.2–6.5)
Beta Globulin: 9.6 % — ABNORMAL HIGH (ref 4.7–7.2)
Gamma Globulin: 15.8 % (ref 11.1–18.8)

## 2012-04-04 LAB — PROTEIN ELECTROPHORESIS, URINE REFLEX

## 2012-04-10 ENCOUNTER — Encounter: Payer: Self-pay | Admitting: Family Medicine

## 2012-04-24 ENCOUNTER — Other Ambulatory Visit: Payer: Self-pay | Admitting: *Deleted

## 2012-04-24 ENCOUNTER — Telehealth: Payer: Self-pay | Admitting: *Deleted

## 2012-04-24 MED ORDER — ACETAMINOPHEN 500 MG PO CAPS: 500.0000 mg | ORAL_CAPSULE | Freq: Three times a day (TID) | ORAL | Status: DC | PRN

## 2012-04-24 NOTE — Telephone Encounter (Signed)
Paperwork from [employer] in your IN box. Signature required.

## 2012-04-25 NOTE — Telephone Encounter (Signed)
Filled and placed in Kim's box. 

## 2012-04-26 NOTE — Telephone Encounter (Signed)
Message left notifying Marylene Land at [facility] of completed forms. Copied and original placed up front for pick up.

## 2012-05-09 ENCOUNTER — Ambulatory Visit: Payer: Medicare Other | Admitting: Family Medicine

## 2012-05-09 DIAGNOSIS — Z0289 Encounter for other administrative examinations: Secondary | ICD-10-CM

## 2012-06-05 ENCOUNTER — Telehealth: Payer: Self-pay | Admitting: *Deleted

## 2012-06-05 MED ORDER — CIPROFLOXACIN HCL 250 MG PO TABS: 250.0000 mg | ORAL_TABLET | Freq: Two times a day (BID) | ORAL | Status: DC

## 2012-06-05 NOTE — Telephone Encounter (Signed)
Thanks, please fax

## 2012-06-05 NOTE — Telephone Encounter (Signed)
Please call in order for u/a and UCX.  Dx confusion.  See if there are other localizing symptoms.  If not start, cipro 250mg  po bid x3 days, #6, no RF.  Please add to med list.  Thanks.

## 2012-06-05 NOTE — Telephone Encounter (Signed)
Marylene Land called stating that patient may have a UTI and request an order to check her urine. Patient has shown more signs of confusion and agitation. Patient has been aggressive and has pulled the fire alarm several times. Please advise.

## 2012-06-05 NOTE — Telephone Encounter (Signed)
Faxed and [employer].

## 2012-06-05 NOTE — Telephone Encounter (Signed)
Order needs to be written and faxed to [phone] along with the Rx for Cipro.  Becky Adkins says there are no other localizing symptoms orther than what would point to UTI.  She has been very confused and became aggressive yesterday with one of the other residents and then later didn't even remember it and Becky Adkins says she has an odor to her urine.  I added the Cipro to meds list and printed Rx for your signature and will fax it along with your UA and UCX order.

## 2012-06-05 NOTE — Telephone Encounter (Signed)
Becky Adkins call stating that

## 2012-06-07 DIAGNOSIS — F29 Unspecified psychosis not due to a substance or known physiological condition: Secondary | ICD-10-CM | POA: Diagnosis not present

## 2012-06-07 DIAGNOSIS — R82998 Other abnormal findings in urine: Secondary | ICD-10-CM | POA: Diagnosis not present

## 2012-06-13 ENCOUNTER — Encounter: Payer: Self-pay | Admitting: Family Medicine

## 2012-06-13 ENCOUNTER — Ambulatory Visit (INDEPENDENT_AMBULATORY_CARE_PROVIDER_SITE_OTHER): Payer: Medicare Other | Admitting: Family Medicine

## 2012-06-13 VITALS — BP 120/64 | Temp 97.8°F | Wt 168.2 lb

## 2012-06-13 DIAGNOSIS — E039 Hypothyroidism, unspecified: Secondary | ICD-10-CM | POA: Diagnosis not present

## 2012-06-13 DIAGNOSIS — M353 Polymyalgia rheumatica: Secondary | ICD-10-CM

## 2012-06-13 DIAGNOSIS — F0390 Unspecified dementia without behavioral disturbance: Secondary | ICD-10-CM

## 2012-06-13 MED ORDER — TRAZODONE 25 MG HALF TABLET: 25.0000 mg | ORAL_TABLET | Freq: Every day | ORAL | Status: DC

## 2012-06-13 NOTE — Assessment & Plan Note (Signed)
Check ESR - if low, may slowly taper prednisone.

## 2012-06-13 NOTE — Patient Instructions (Addendum)
Restart multivitamin. Start trazodone at 1/2 tab nightly (25mg ). Thyroid check today. Good to see you today, call us with questions. Keep appointment August 20th for follow up, sooner if needed. Urine looking normal when checked.

## 2012-06-13 NOTE — Progress Notes (Signed)
  Subjective:    Patient ID: Becky Adkins, female    DOB: 06/21/1925, 78 y.o.   MRN: [id number]  HPI CC: increased agitation, aggression  Presents with son who helps give story given h/o dementia.  Noticed increased agitation over last few months - more upset thinking parents had just died.  2 weekends ago had sharp words with fellow resident.  Does not remember specifics of this but son has been called recently by [facility] for episodes.  More worrying about being in room by herself.  Anxiety and agitation seems to be related to periods of confusion.  Son thinks staff does good job redirection and implementing other strategies to minimize confusion.  Recent UA/UCx negative for infection.  H/o PMR - denies hip pain, shoulder pain.  Past Medical History  Diagnosis Date  . Hypertension   . Hyperlipidemia   . Polymyalgia rheumatica     on prednisone, has seen neuroophthalmologist, had normal temporal artery biopsy at dx  . History of GI bleed 12/2008    w/ submucosal gastric ulcers  . Dementia     mild, significant short term memory loss, eval by memory clinic and didn't think needed further meds  . Hiatal hernia   . Personal history of TIA (transient ischemic attack) 2009    neurology w/u with normal CT head, echo, carotid US  . Anemia     s/p hematology workup, h/o B12 shots  . Macular degeneration     and cataracts  . Deafness     right ear  . GERD (gastroesophageal reflux disease)     severe on EGD with HH, H pylori neg  . Hypothyroid     s/p thyroidectomy  . Osteoporosis     was on boniva  . 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 Migraines     none recently  . Arthritis   . Depression   . Urinary incontinence   . CAD (coronary artery disease)     cath 12/11 at [facility]: severe 3-v CAD including LM 80% LAD 70% LCX 90% RCA 50-60%, decided medical treatment     Review of Systems Per HPI    Objective:   Physical Exam    Nursing note and vitals reviewed. Constitutional: She appears well-developed and well-nourished. No distress.  HENT:  Head: Normocephalic and atraumatic.  Mouth/Throat: Oropharynx is clear and moist. No oropharyngeal exudate.  Eyes: Conjunctivae and EOM are normal. Pupils are equal, round, and reactive to light.  Neck: Normal range of motion. Neck supple. Carotid bruit is not present. No thyromegaly present.  Cardiovascular: Normal rate, regular rhythm, normal heart sounds and intact distal pulses.   No murmur heard. Pulmonary/Chest: Effort normal and breath sounds normal. No respiratory distress. She has no wheezes. She has no rales.  Musculoskeletal: She exhibits no edema.  Lymphadenopathy:    She has no cervical adenopathy.  Skin: Skin is warm and dry. No rash noted.  Psychiatric: She has a normal mood and affect.       Assessment & Plan:

## 2012-06-13 NOTE — Assessment & Plan Note (Addendum)
Recent deterioration, however currently sxs stable.  Son thinks depression may be contributing currently. Discussed options. Did not respond well to seroquel in past. Could consider abilify. Will start trazodone to see if any improvement in sxs, and pt endorsing some trouble with sleep at night 2/2 anxiety, will see if trazodone helps this as well. Discussed slightly increased CV risk on this med, but low relative to other antidepressants. Check TSH today. Recent UA / UCx WNL.

## 2012-06-14 LAB — SEDIMENTATION RATE: Sed Rate: 87 mm/h — ABNORMAL HIGH (ref 0–22)

## 2012-06-16 ENCOUNTER — Other Ambulatory Visit: Payer: Self-pay

## 2012-06-16 NOTE — Telephone Encounter (Signed)
Ok to place order.  plz fax.  preservision 1 tab daily.

## 2012-06-16 NOTE — Telephone Encounter (Signed)
Becky with [facility]; pt to restart Preservision; needs order. Becky # [id number].

## 2012-06-16 NOTE — Telephone Encounter (Signed)
Order faxed to Bonita Quin and [place] at [facility].

## 2012-08-15 ENCOUNTER — Encounter: Payer: Self-pay | Admitting: Family Medicine

## 2012-08-15 ENCOUNTER — Ambulatory Visit (INDEPENDENT_AMBULATORY_CARE_PROVIDER_SITE_OTHER): Payer: Medicare Other | Admitting: Family Medicine

## 2012-08-15 VITALS — BP 124/64 | HR 60 | Temp 97.6°F | Ht 60.25 in | Wt 163.0 lb

## 2012-08-15 DIAGNOSIS — Z1231 Encounter for screening mammogram for malignant neoplasm of breast: Secondary | ICD-10-CM

## 2012-08-15 DIAGNOSIS — E785 Hyperlipidemia, unspecified: Secondary | ICD-10-CM

## 2012-08-15 DIAGNOSIS — I2510 Atherosclerotic heart disease of native coronary artery without angina pectoris: Secondary | ICD-10-CM

## 2012-08-15 DIAGNOSIS — E039 Hypothyroidism, unspecified: Secondary | ICD-10-CM

## 2012-08-15 DIAGNOSIS — F329 Major depressive disorder, single episode, unspecified: Secondary | ICD-10-CM

## 2012-08-15 DIAGNOSIS — M353 Polymyalgia rheumatica: Secondary | ICD-10-CM | POA: Diagnosis not present

## 2012-08-15 DIAGNOSIS — Z0000 Encounter for general adult medical examination without abnormal findings: Secondary | ICD-10-CM | POA: Diagnosis not present

## 2012-08-15 DIAGNOSIS — D649 Anemia, unspecified: Secondary | ICD-10-CM

## 2012-08-15 DIAGNOSIS — H3530 Unspecified macular degeneration: Secondary | ICD-10-CM

## 2012-08-15 DIAGNOSIS — H547 Unspecified visual loss: Secondary | ICD-10-CM | POA: Diagnosis not present

## 2012-08-15 DIAGNOSIS — F0390 Unspecified dementia without behavioral disturbance: Secondary | ICD-10-CM

## 2012-08-15 DIAGNOSIS — I10 Essential (primary) hypertension: Secondary | ICD-10-CM

## 2012-08-15 LAB — RENAL FUNCTION PANEL
Albumin: 3.7 g/dL (ref 3.5–5.2)
CO2: 26 mEq/L (ref 19–32)
Calcium: 8.9 mg/dL (ref 8.4–10.5)
Chloride: 102 mEq/L (ref 96–112)
Glucose, Bld: 103 mg/dL — ABNORMAL HIGH (ref 70–99)
Potassium: 4.2 mEq/L (ref 3.5–5.1)
Sodium: 136 mEq/L (ref 135–145)

## 2012-08-15 LAB — CBC WITH DIFFERENTIAL/PLATELET
Basophils Relative: 0.5 % (ref 0.0–3.0)
Eosinophils Absolute: 0.1 10*3/uL (ref 0.0–0.7)
Eosinophils Relative: 1.4 % (ref 0.0–5.0)
HCT: 31 % — ABNORMAL LOW (ref 36.0–46.0)
Hemoglobin: 9.9 g/dL — ABNORMAL LOW (ref 12.0–15.0)
MCHC: 32 g/dL (ref 30.0–36.0)
MCV: 92 fl (ref 78.0–100.0)
Monocytes Absolute: 0.5 10*3/uL (ref 0.1–1.0)
Neutro Abs: 4.5 10*3/uL (ref 1.4–7.7)
RBC: 3.37 Mil/uL — ABNORMAL LOW (ref 3.87–5.11)
WBC: 6.6 10*3/uL (ref 4.5–10.5)

## 2012-08-15 MED ORDER — PREDNISONE 1 MG PO TABS: 4.0000 mg | ORAL_TABLET | Freq: Every day | ORAL | Status: DC

## 2012-08-15 NOTE — Progress Notes (Signed)
Subjective:    Patient ID: Becky Adkins, female    DOB: February 25, 1925, 78 y.o.   MRN: [id number]  HPI CC: medicare wellness visit.  "I feel good"  Presents with son who helps give story - 2/2 dementia.  Would like ophtho referral.  Not seen since 2010.  Easily loses glasses.  Last visit trazodone started - significant improvement in mood, sleep.  Would like to continue low dose.  Some bladder incontinence - slightly worse.  Uses pads daily.  No bowel incontinence.  PMR - very stable.  Has been on prednisone 5mg  daily for at least 5 years.  Preventative: Colonoscopy - 2006 and normal per records/family.  Will age out. Mammogram - unsure latest.  Would like [place] to schedule.  Breast exam today. Tetanus - 2012 pneuovax - 2012 zostavax - 2008 Flu shot yearly. Advanced directives: does not have living will.  "i've had a good life".  Son is HCPOA.  Would like MOST form, considering DNR.  Denies falls in last year.  Wt Readings from Last 3 Encounters:  08/15/12 163 lb (73.936 kg)  06/13/12 168 lb 4 oz (76.318 kg)  03/29/12 167 lb (75.751 kg)    Medications and allergies reviewed and updated in chart.  Past histories reviewed and updated if relevant as below. Patient Active Problem List  Diagnosis  . CHEST PAIN UNSPECIFIED  . CAD (coronary artery disease)  . Hyperlipidemia  . Hypertension  . Polymyalgia rheumatica  . Dementia  . History of GI bleed  . Personal history of TIA (transient ischemic attack)  . Macular degeneration  . GERD (gastroesophageal reflux disease)  . Hypothyroid  . Osteoporosis  . Depression  . Fall  . Chronic kidney disease (CKD), stage III (moderate)  . Anemia  . Cough   Past Medical History  Diagnosis Date  . Hypertension   . Hyperlipidemia   . Polymyalgia rheumatica     on prednisone, has seen neuroophthalmologist, had normal temporal artery biopsy at dx  . History of GI bleed 12/2008    w/ submucosal gastric ulcers  . Dementia      mild, significant short term memory loss, eval by memory clinic and didn't think needed further meds  . Hiatal hernia   . Personal history of TIA (transient ischemic attack) 2009    neurology w/u with normal CT head, echo, carotid US  . Anemia     s/p hematology workup, h/o B12 shots  . Macular degeneration     and cataracts  . Deafness     right ear  . GERD (gastroesophageal reflux disease)     severe on EGD with HH, H pylori neg  . Hypothyroid     s/p thyroidectomy  . Osteoporosis     was on boniva  . 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 Migraines     none recently  . Arthritis   . Depression   . Urinary incontinence   . CAD (coronary artery disease)     cath 12/11 at [facility]: severe 3-v CAD including LM 80% LAD 70% LCX 90% RCA 50-60%, decided medical treatment   Past Surgical History  Procedure Date  . Thyroidectomy     for benign tumor  . Appendectomy   . Dilation and curettage of uterus   . Hospitalization 2007    global amnesia  . Hospitalization 12/2008    bleeding ulcer, asp PNA  . Hospitalization 2011    CP, r/o  x 2 (neg cardiolite and ETT)  . Esophagogastroduodenoscopy 07/2009    LA grade C reflux esophagitis, HH, single gastric nodule ([place])  . Cholecystectomy   . Colonoscopy 2006    normal per previous records   History  Substance Use Topics  . Smoking status: Never Smoker   . Smokeless tobacco: Never Used  . Alcohol Use: No     Wine very rarely   Family History  Problem Relation Age of Onset  . Cancer Mother     cervical  . Coronary artery disease Father   . Alcohol abuse Father   . Diabetes Paternal Aunt    Allergies  Allergen Reactions  . Aricept (Donepezil Hydrochloride) Other (See Comments)    Agitated, aggressive  . Codeine     Nausea   . Penicillins     Rash    Current Outpatient Prescriptions on File Prior to Visit  Medication Sig Dispense Refill  . Acetaminophen 500 MG coapsule Take 1  capsule (500 mg total) by mouth 3 (three) times daily as needed for fever or pain.  90 capsule  0  . aspirin 81 MG EC tablet Take 81 mg by mouth daily.        . citalopram (CELEXA) 20 MG tablet Take 30 mg by mouth daily. 1 1/2 tablets daily      . cyanocobalamin (,VITAMIN B-12,) 1000 MCG/ML injection Inject 1,000 mcg into the muscle every 30 (thirty) days.      . isosorbide mononitrate (IMDUR) 30 MG 24 hr tablet Take 1 tablet (30 mg total) by mouth 2 (two) times daily.  60 tablet  6  . levothyroxine (SYNTHROID, LEVOTHROID) 50 MCG tablet Take one daily by mouth, but take two pills on Wednesday and Sunday.  40 tablet  11  . losartan (COZAAR) 50 MG tablet Take 1 tablet (50 mg total) by mouth daily.      . metoprolol tartrate (LOPRESSOR) 25 MG tablet Take 25 mg by mouth 2 (two) times daily.        . nitroGLYCERIN (NITROSTAT) 0.4 MG SL tablet Place 0.4 mg under the tongue every 5 (five) minutes as needed.        . pantoprazole (PROTONIX) 40 MG tablet Take 40 mg by mouth daily.        . traZODone (DESYREL) 25 mg TABS Take 0.5 tablets (25 mg total) by mouth at bedtime.  45 tablet  3  . Multiple Vitamins-Minerals (PRESERVISION AREDS 2 PO) Take 1 capsule by mouth 2 (two) times daily.        Marland Kitchen DISCONTD: atorvastatin (LIPITOR) 20 MG tablet Take 1 tablet (20 mg total) by mouth daily.  30 tablet  11     Review of Systems  Constitutional: Negative for fever, chills, activity change, appetite change, fatigue and unexpected weight change.  HENT: Negative for hearing loss and neck pain.   Eyes: Negative for visual disturbance.  Respiratory: Negative for cough, chest tightness, shortness of breath and wheezing.   Cardiovascular: Negative for chest pain, palpitations and leg swelling.  Gastrointestinal: Negative for nausea, vomiting, abdominal pain, diarrhea, constipation, blood in stool and abdominal distention.  Genitourinary: Negative for hematuria and difficulty urinating.  Musculoskeletal: Negative for  myalgias and arthralgias.  Skin: Negative for rash.  Neurological: Negative for dizziness, seizures, syncope and headaches.  Hematological: Does not bruise/bleed easily.  Psychiatric/Behavioral: Negative for dysphoric mood. The patient is not nervous/anxious.        Objective:   Physical Exam  Nursing note and  vitals reviewed. Constitutional: She is oriented to person, place, and time. She appears well-developed and well-nourished. No distress.  HENT:  Head: Normocephalic and atraumatic.  Right Ear: Hearing, tympanic membrane, external ear and ear canal normal.  Left Ear: Hearing, tympanic membrane, external ear and ear canal normal.  Nose: Nose normal.  Mouth/Throat: Oropharynx is clear and moist. No oropharyngeal exudate.  Eyes: Conjunctivae and EOM are normal. Pupils are equal, round, and reactive to light. No scleral icterus.  Neck: Normal range of motion. Neck supple. No thyromegaly present.  Cardiovascular: Normal rate, regular rhythm, normal heart sounds and intact distal pulses.   No murmur heard. Pulses:      Radial pulses are 2+ on the right side, and 2+ on the left side.  Pulmonary/Chest: Effort normal and breath sounds normal. No respiratory distress. She has no wheezes. She has no rales. Right breast exhibits no inverted nipple, no mass, no nipple discharge, no skin change and no tenderness. Left breast exhibits no inverted nipple, no mass, no nipple discharge, no skin change and no tenderness. Breasts are symmetrical.  Abdominal: Soft. Bowel sounds are normal. She exhibits no distension and no mass. There is no tenderness. There is no rebound and no guarding.  Genitourinary:       deferred  Musculoskeletal: Normal range of motion. She exhibits no edema.  Lymphadenopathy:    She has no cervical adenopathy.    She has no axillary adenopathy.       Right axillary: No lateral adenopathy present.       Left axillary: No lateral adenopathy present.      Right: No  supraclavicular adenopathy present.       Left: No supraclavicular adenopathy present.  Neurological: She is alert and oriented to person, place, and time.       CN grossly intact, station and gait intact  Skin: Skin is warm and dry. No rash noted.  Psychiatric: She has a normal mood and affect. Her behavior is normal. Judgment and thought content normal.       Assessment & Plan:

## 2012-08-15 NOTE — Patient Instructions (Addendum)
Take preservision daily - multivitamins for eyes. Lets try decreasing prednisone to 4mg  daily (four 1 mg tablets daily).  Continue this dose until next appointment.  Watch for increased morning stiffness or aches, if that happens let me know. I recommend flu shot this season. Good to see you today. Pass by Marion's office to schedule mammogram and eye doctor appointment. Blood work today.

## 2012-08-16 ENCOUNTER — Telehealth: Payer: Self-pay | Admitting: Family Medicine

## 2012-08-16 DIAGNOSIS — Z0000 Encounter for general adult medical examination without abnormal findings: Secondary | ICD-10-CM | POA: Insufficient documentation

## 2012-08-16 NOTE — Assessment & Plan Note (Signed)
BP Readings from Last 3 Encounters:  08/15/12 124/64  06/13/12 120/64  03/29/12 162/80  chronic, stable. Continue meds.

## 2012-08-16 NOTE — Assessment & Plan Note (Signed)
Chronic ev disease, medical management.  Asxs.  Continue aspirin and imdur.

## 2012-08-16 NOTE — Assessment & Plan Note (Signed)
Recheck today. S/p unrevealing w/u in past. ?epo candidate, given h/o dementia, likely not.

## 2012-08-16 NOTE — Telephone Encounter (Signed)
Can we mail patient a MOST form (pink form) as I forgot to give yesterday, and ask them to review and then bring in next visit and we will finish.

## 2012-08-16 NOTE — Assessment & Plan Note (Signed)
Asymptomatic on prednisone 5mg  daily.  Recheck ESR today, discussed trial of slow titration off prednisone as has been very stable from joint ache/stiffness over last several years.  ESR has remained elevated but stable, latest 87.

## 2012-08-16 NOTE — Assessment & Plan Note (Signed)
Stable on celexa and trazodone.

## 2012-08-16 NOTE — Assessment & Plan Note (Signed)
I have personally reviewed the Medicare Annual Wellness questionnaire and have noted 1. The patient's medical and social history 2. Their use of alcohol, tobacco or illicit drugs 3. Their current medications and supplements 4. The patient's functional ability including ADL's, fall risks, home safety risks and hearing or visual impairment. 5. Diet and physical activity 6. Evidence for depression or mood disorders The patients weight, height, BMI have been recorded in the chart.  Hearing and vision has been addressed. I have made referrals, counseling and provided education to the patient based review of the above and I have provided the pt with a written personalized care plan for preventive services. See scanned questionairre. Advanced directives discussed: son is HCPOA.  Considering DNR.  Request MOST form.  Reviewed preventative protocols and updated unless pt declined. Schedule mammogram. May age out of colon cancer screening, cervical cancer screening. Recommended schedule vision exam as last 2010.

## 2012-08-16 NOTE — Assessment & Plan Note (Signed)
Chronic, stable.  Trazodone has improved behavior, sleep and mood

## 2012-08-16 NOTE — Assessment & Plan Note (Addendum)
rec schedule vision exam, referred to ophtho.  Decreased visual acuity as well as h/o macular degeneration.  Pt loses glasses easily.

## 2012-08-16 NOTE — Assessment & Plan Note (Addendum)
Lab Results  Component Value Date   CHOL 177 01/13/2012   HDL 48.60 01/13/2012   LDLCALC 101* 01/13/2012   TRIG 137.0 01/13/2012   CHOLHDL 4 01/13/2012  chronic, stable on lipitor.  Consider increasing to 40mg  daily.

## 2012-08-16 NOTE — Assessment & Plan Note (Signed)
Recheck today.  Has developed CKD stage 3 since 2011.  Seems stable.

## 2012-08-16 NOTE — Assessment & Plan Note (Signed)
Normal TSH on last check after titrating levothyroxine Lab Results  Component Value Date   TSH 1.45 06/13/2012

## 2012-08-17 NOTE — Telephone Encounter (Signed)
Form mailed to son as directed.

## 2012-10-04 ENCOUNTER — Other Ambulatory Visit: Payer: Self-pay | Admitting: Family Medicine

## 2012-10-11 ENCOUNTER — Other Ambulatory Visit: Payer: Self-pay | Admitting: Family Medicine

## 2012-10-19 DIAGNOSIS — Z23 Encounter for immunization: Secondary | ICD-10-CM | POA: Diagnosis not present

## 2012-11-10 ENCOUNTER — Telehealth: Payer: Self-pay

## 2012-11-10 DIAGNOSIS — I2510 Atherosclerotic heart disease of native coronary artery without angina pectoris: Secondary | ICD-10-CM | POA: Diagnosis not present

## 2012-11-10 DIAGNOSIS — Z8719 Personal history of other diseases of the digestive system: Secondary | ICD-10-CM | POA: Diagnosis not present

## 2012-11-10 DIAGNOSIS — R262 Difficulty in walking, not elsewhere classified: Secondary | ICD-10-CM | POA: Diagnosis not present

## 2012-11-10 DIAGNOSIS — IMO0001 Reserved for inherently not codable concepts without codable children: Secondary | ICD-10-CM | POA: Diagnosis not present

## 2012-11-10 DIAGNOSIS — I10 Essential (primary) hypertension: Secondary | ICD-10-CM | POA: Diagnosis not present

## 2012-11-10 DIAGNOSIS — F0390 Unspecified dementia without behavioral disturbance: Secondary | ICD-10-CM | POA: Diagnosis not present

## 2012-11-10 DIAGNOSIS — D510 Vitamin B12 deficiency anemia due to intrinsic factor deficiency: Secondary | ICD-10-CM | POA: Diagnosis not present

## 2012-11-10 NOTE — Telephone Encounter (Signed)
Sherry with [facility] said pt just admitted to their care and wanted to know date of last Vit B 12 injection. Pt has not received Vit B 12 at our office; refer to 02/16/12 note under lab tab. Pt had been receiving at [facility]. Cordelia Pen will contact [employer] for date of last Vit B 12 injection.

## 2012-11-13 DIAGNOSIS — R262 Difficulty in walking, not elsewhere classified: Secondary | ICD-10-CM | POA: Diagnosis not present

## 2012-11-13 DIAGNOSIS — IMO0001 Reserved for inherently not codable concepts without codable children: Secondary | ICD-10-CM | POA: Diagnosis not present

## 2012-11-13 DIAGNOSIS — D510 Vitamin B12 deficiency anemia due to intrinsic factor deficiency: Secondary | ICD-10-CM | POA: Diagnosis not present

## 2012-11-13 DIAGNOSIS — I2510 Atherosclerotic heart disease of native coronary artery without angina pectoris: Secondary | ICD-10-CM | POA: Diagnosis not present

## 2012-11-13 DIAGNOSIS — F0390 Unspecified dementia without behavioral disturbance: Secondary | ICD-10-CM | POA: Diagnosis not present

## 2012-11-13 DIAGNOSIS — I10 Essential (primary) hypertension: Secondary | ICD-10-CM | POA: Diagnosis not present

## 2012-11-14 DIAGNOSIS — IMO0001 Reserved for inherently not codable concepts without codable children: Secondary | ICD-10-CM | POA: Diagnosis not present

## 2012-11-14 DIAGNOSIS — I2510 Atherosclerotic heart disease of native coronary artery without angina pectoris: Secondary | ICD-10-CM | POA: Diagnosis not present

## 2012-11-14 DIAGNOSIS — R262 Difficulty in walking, not elsewhere classified: Secondary | ICD-10-CM | POA: Diagnosis not present

## 2012-11-14 DIAGNOSIS — F0390 Unspecified dementia without behavioral disturbance: Secondary | ICD-10-CM | POA: Diagnosis not present

## 2012-11-14 DIAGNOSIS — D510 Vitamin B12 deficiency anemia due to intrinsic factor deficiency: Secondary | ICD-10-CM | POA: Diagnosis not present

## 2012-11-14 DIAGNOSIS — I10 Essential (primary) hypertension: Secondary | ICD-10-CM | POA: Diagnosis not present

## 2012-11-17 DIAGNOSIS — D510 Vitamin B12 deficiency anemia due to intrinsic factor deficiency: Secondary | ICD-10-CM

## 2012-11-17 DIAGNOSIS — R262 Difficulty in walking, not elsewhere classified: Secondary | ICD-10-CM

## 2012-11-17 DIAGNOSIS — F0390 Unspecified dementia without behavioral disturbance: Secondary | ICD-10-CM

## 2012-11-17 DIAGNOSIS — IMO0001 Reserved for inherently not codable concepts without codable children: Secondary | ICD-10-CM | POA: Diagnosis not present

## 2012-11-21 DIAGNOSIS — I2510 Atherosclerotic heart disease of native coronary artery without angina pectoris: Secondary | ICD-10-CM | POA: Diagnosis not present

## 2012-11-21 DIAGNOSIS — I10 Essential (primary) hypertension: Secondary | ICD-10-CM | POA: Diagnosis not present

## 2012-11-21 DIAGNOSIS — F0390 Unspecified dementia without behavioral disturbance: Secondary | ICD-10-CM | POA: Diagnosis not present

## 2012-11-21 DIAGNOSIS — R262 Difficulty in walking, not elsewhere classified: Secondary | ICD-10-CM | POA: Diagnosis not present

## 2012-11-21 DIAGNOSIS — IMO0001 Reserved for inherently not codable concepts without codable children: Secondary | ICD-10-CM | POA: Diagnosis not present

## 2012-11-21 DIAGNOSIS — D510 Vitamin B12 deficiency anemia due to intrinsic factor deficiency: Secondary | ICD-10-CM | POA: Diagnosis not present

## 2012-12-12 DIAGNOSIS — I10 Essential (primary) hypertension: Secondary | ICD-10-CM | POA: Diagnosis not present

## 2012-12-12 DIAGNOSIS — F0390 Unspecified dementia without behavioral disturbance: Secondary | ICD-10-CM | POA: Diagnosis not present

## 2012-12-12 DIAGNOSIS — I2510 Atherosclerotic heart disease of native coronary artery without angina pectoris: Secondary | ICD-10-CM | POA: Diagnosis not present

## 2012-12-12 DIAGNOSIS — D510 Vitamin B12 deficiency anemia due to intrinsic factor deficiency: Secondary | ICD-10-CM | POA: Diagnosis not present

## 2012-12-12 DIAGNOSIS — R262 Difficulty in walking, not elsewhere classified: Secondary | ICD-10-CM | POA: Diagnosis not present

## 2012-12-12 DIAGNOSIS — IMO0001 Reserved for inherently not codable concepts without codable children: Secondary | ICD-10-CM | POA: Diagnosis not present

## 2012-12-14 ENCOUNTER — Ambulatory Visit: Payer: Medicare Other | Admitting: Family Medicine

## 2012-12-15 ENCOUNTER — Ambulatory Visit: Payer: Medicare Other | Admitting: Family Medicine

## 2012-12-27 DIAGNOSIS — Z66 Do not resuscitate: Secondary | ICD-10-CM

## 2012-12-27 HISTORY — DX: Do not resuscitate: Z66

## 2013-01-11 ENCOUNTER — Ambulatory Visit (INDEPENDENT_AMBULATORY_CARE_PROVIDER_SITE_OTHER): Payer: Medicare Other | Admitting: Family Medicine

## 2013-01-11 ENCOUNTER — Encounter: Payer: Self-pay | Admitting: Family Medicine

## 2013-01-11 VITALS — BP 132/72 | HR 66 | Temp 98.0°F | Wt 158.0 lb

## 2013-01-11 DIAGNOSIS — I10 Essential (primary) hypertension: Secondary | ICD-10-CM

## 2013-01-11 DIAGNOSIS — E039 Hypothyroidism, unspecified: Secondary | ICD-10-CM

## 2013-01-11 DIAGNOSIS — M810 Age-related osteoporosis without current pathological fracture: Secondary | ICD-10-CM

## 2013-01-11 DIAGNOSIS — D649 Anemia, unspecified: Secondary | ICD-10-CM

## 2013-01-11 DIAGNOSIS — H9190 Unspecified hearing loss, unspecified ear: Secondary | ICD-10-CM

## 2013-01-11 DIAGNOSIS — N183 Chronic kidney disease, stage 3 unspecified: Secondary | ICD-10-CM

## 2013-01-11 DIAGNOSIS — Z66 Do not resuscitate: Secondary | ICD-10-CM

## 2013-01-11 DIAGNOSIS — M353 Polymyalgia rheumatica: Secondary | ICD-10-CM

## 2013-01-11 LAB — CBC WITH DIFFERENTIAL/PLATELET
Basophils Relative: 0.5 % (ref 0.0–3.0)
Eosinophils Absolute: 0.3 10*3/uL (ref 0.0–0.7)
Eosinophils Relative: 5.5 % — ABNORMAL HIGH (ref 0.0–5.0)
HCT: 30.3 % — ABNORMAL LOW (ref 36.0–46.0)
Lymphs Abs: 1.3 10*3/uL (ref 0.7–4.0)
MCHC: 32.8 g/dL (ref 30.0–36.0)
MCV: 91.3 fl (ref 78.0–100.0)
Monocytes Absolute: 0.6 10*3/uL (ref 0.1–1.0)
Neutrophils Relative %: 54.5 % (ref 43.0–77.0)
Platelets: 226 10*3/uL (ref 150.0–400.0)
WBC: 4.8 10*3/uL (ref 4.5–10.5)

## 2013-01-11 LAB — RENAL FUNCTION PANEL
Albumin: 3.4 g/dL — ABNORMAL LOW (ref 3.5–5.2)
Calcium: 8.9 mg/dL (ref 8.4–10.5)
Creatinine, Ser: 1.3 mg/dL — ABNORMAL HIGH (ref 0.4–1.2)
Glucose, Bld: 94 mg/dL (ref 70–99)
Phosphorus: 3.1 mg/dL (ref 2.3–4.6)
Potassium: 4.5 mEq/L (ref 3.5–5.1)
Sodium: 137 mEq/L (ref 135–145)

## 2013-01-11 NOTE — Progress Notes (Signed)
  Subjective:    Patient ID: Becky Adkins, female    DOB: 1925-12-25, 79 y.o.   MRN: [id number]  HPI CC: 5 mo f/u  "I feel good" Presents with son who helps give story - 2/2 dementia.  PMR doing well - would like to continue slow taper off.  No joint pains.  No headaches or vision changes.  Trazodone working well - desires to continue along with SSRI.  Would like MOST form filled out today.  DNR.  Having some trouble hearing from left ear recently - buzzing and hollow room sensation.  No pain, recent infection.  H/o R hearing loss from mumps infection (childhood).  Past Medical History  Diagnosis Date  . Hypertension   . Hyperlipidemia   . Polymyalgia rheumatica     on prednisone, has seen neuroophthalmologist, had normal temporal artery biopsy at dx  . History of GI bleed 12/2008    w/ submucosal gastric ulcers  . Dementia     mild, significant short term memory loss, eval by memory clinic and didn't think needed further meds  . Hiatal hernia   . Personal history of TIA (transient ischemic attack) 2009    neurology w/u with normal CT head, echo, carotid US  . Anemia     s/p hematology workup, h/o B12 shots  . Macular degeneration     and cataracts  . Deafness     right ear  . GERD (gastroesophageal reflux disease)     severe on EGD with HH, H pylori neg  . Hypothyroid     s/p thyroidectomy  . Osteoporosis     was on boniva  . 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 Migraines     none recently  . Arthritis   . Depression   . Urinary incontinence   . CAD (coronary artery disease)     cath 12/11 at [facility]: severe 3-v CAD including LM 80% LAD 70% LCX 90% RCA 50-60%, decided medical treatment     Review of Systems Per HPI    Objective:   Physical Exam  Nursing note and vitals reviewed. Constitutional: She appears well-developed and well-nourished. No distress.  HENT:  Mouth/Throat: Oropharynx is clear and moist. No  oropharyngeal exudate.  Eyes: Conjunctivae normal and EOM are normal. Pupils are equal, round, and reactive to light.  Cardiovascular: Normal rate, regular rhythm, normal heart sounds and intact distal pulses.   No murmur heard. Pulmonary/Chest: Effort normal and breath sounds normal. No respiratory distress. She has no wheezes. She has no rales.  Musculoskeletal: She exhibits no edema.  Skin: Skin is warm and dry. No rash noted. There is pallor (mild ).  Psychiatric:       Pleasant, poor memory but understands discussion of MOST form       Assessment & Plan:

## 2013-01-11 NOTE — Patient Instructions (Signed)
Blood work today.  Decrease prednisone to 3mg  daily. MOST form filled out. Good to see you today, call us with quesitons.

## 2013-01-11 NOTE — Assessment & Plan Note (Signed)
Slowly titrating down from steroid.

## 2013-01-11 NOTE — Assessment & Plan Note (Signed)
Will decrease prednisone to 3mg  today, has been on 4mg  for last 4-6 months. Has been very stable, denies arm/leg pain

## 2013-01-11 NOTE — Assessment & Plan Note (Signed)
MOST form filled 12/2012, ok for limited interventions, ok for transfer to ER but try to avoid ICU Does not want compressions or intubation. Would consider short term IV fluid, abx and even feeding tube

## 2013-01-11 NOTE — Assessment & Plan Note (Signed)
Noted some trouble with hearing from left ear today (chronic R hearing loss) Will offer audiology referral next appointment.

## 2013-01-11 NOTE — Assessment & Plan Note (Signed)
Recheck today.  S/p unrevealing w/u in past.  Doubt not epo candidate 2/2 dementia/comorbidities. Consider iron if remaining Hgb <10

## 2013-01-11 NOTE — Assessment & Plan Note (Signed)
Check TSH today

## 2013-01-12 ENCOUNTER — Ambulatory Visit: Payer: Medicare Other

## 2013-01-12 ENCOUNTER — Encounter: Payer: Self-pay | Admitting: *Deleted

## 2013-01-12 DIAGNOSIS — M353 Polymyalgia rheumatica: Secondary | ICD-10-CM

## 2013-01-17 ENCOUNTER — Telehealth: Payer: Self-pay | Admitting: Family Medicine

## 2013-01-17 NOTE — Telephone Encounter (Signed)
Caller: Becky/; Phone: [phone]; Reason for Call: Becky from [facility] is calling to report that the pt has a bad cough.  Cough is productive.  Pt has decreased appetite.  Pt does not seem to have a fever.  Caller is requesting medication to be called in for patient.  Onset of symptoms was 01/14/13.  OFFICE PLEASE FOLLOW UP WITH CALLER REGARDING MEDICATION FOR PATIENT WITH COUGH

## 2013-01-18 MED ORDER — BENZONATATE 100 MG PO CAPS: 100.0000 mg | ORAL_CAPSULE | Freq: Two times a day (BID) | ORAL | Status: DC | PRN

## 2013-01-18 NOTE — Telephone Encounter (Signed)
Becky notified.

## 2013-01-18 NOTE — Telephone Encounter (Signed)
Jacki Cones remember I'm not here Wednesday afternoons - just getting this message now.  Selena Batten - can we call [facility] - may try tessalon perls (sent in) - swallow,don't chew.  If any fever, not improving or worsening cough, please come in for evaluation.

## 2013-02-07 DIAGNOSIS — M2550 Pain in unspecified joint: Secondary | ICD-10-CM

## 2013-02-07 DIAGNOSIS — M353 Polymyalgia rheumatica: Secondary | ICD-10-CM

## 2013-02-07 DIAGNOSIS — D510 Vitamin B12 deficiency anemia due to intrinsic factor deficiency: Secondary | ICD-10-CM

## 2013-02-07 DIAGNOSIS — F0390 Unspecified dementia without behavioral disturbance: Secondary | ICD-10-CM

## 2013-03-08 ENCOUNTER — Other Ambulatory Visit: Payer: Self-pay | Admitting: Family Medicine

## 2013-03-08 NOTE — Telephone Encounter (Signed)
Ok to refill 

## 2013-04-06 DIAGNOSIS — D510 Vitamin B12 deficiency anemia due to intrinsic factor deficiency: Secondary | ICD-10-CM

## 2013-04-06 DIAGNOSIS — M2550 Pain in unspecified joint: Secondary | ICD-10-CM

## 2013-04-06 DIAGNOSIS — M353 Polymyalgia rheumatica: Secondary | ICD-10-CM

## 2013-04-06 DIAGNOSIS — F0390 Unspecified dementia without behavioral disturbance: Secondary | ICD-10-CM

## 2013-04-18 ENCOUNTER — Other Ambulatory Visit: Payer: Self-pay | Admitting: Family Medicine

## 2013-06-03 DIAGNOSIS — F0390 Unspecified dementia without behavioral disturbance: Secondary | ICD-10-CM

## 2013-06-03 DIAGNOSIS — M2550 Pain in unspecified joint: Secondary | ICD-10-CM

## 2013-06-03 DIAGNOSIS — M353 Polymyalgia rheumatica: Secondary | ICD-10-CM

## 2013-06-03 DIAGNOSIS — D510 Vitamin B12 deficiency anemia due to intrinsic factor deficiency: Secondary | ICD-10-CM

## 2013-07-30 ENCOUNTER — Telehealth: Payer: Self-pay

## 2013-07-30 DIAGNOSIS — E538 Deficiency of other specified B group vitamins: Secondary | ICD-10-CM | POA: Insufficient documentation

## 2013-07-30 NOTE — Telephone Encounter (Signed)
Becky Adkins at [facility] left v/m requesting order for lab test for Vit B 12 level to validate continued need for B12 inections for Medicare.Pt getting monthly B 12 injections.Please advise.

## 2013-07-30 NOTE — Telephone Encounter (Signed)
Wrote out script and placed in Kim's box.

## 2013-07-31 NOTE — Telephone Encounter (Signed)
Order faxed to [facility] at [place].

## 2013-08-06 DIAGNOSIS — D510 Vitamin B12 deficiency anemia due to intrinsic factor deficiency: Secondary | ICD-10-CM

## 2013-08-06 DIAGNOSIS — M2550 Pain in unspecified joint: Secondary | ICD-10-CM

## 2013-08-06 DIAGNOSIS — M353 Polymyalgia rheumatica: Secondary | ICD-10-CM

## 2013-08-06 DIAGNOSIS — F0390 Unspecified dementia without behavioral disturbance: Secondary | ICD-10-CM

## 2013-08-07 ENCOUNTER — Telehealth: Payer: Self-pay

## 2013-08-07 NOTE — Telephone Encounter (Signed)
[name] with [facility] left v/m;pt has been receiving month B 12 injections at [facility]. [name] request home health lab order for Vitamin B 12 level. Need to determine if pt needs to continue getting B 12 injections or can pt take orally. [name] will see pt on Fri 08/10/13.[name] request cb.

## 2013-08-08 NOTE — Telephone Encounter (Signed)
[name] notified as instructed by telephone. [name] stated that she will check her office on this and call back if necessary.

## 2013-08-08 NOTE — Telephone Encounter (Signed)
Please refer to prior phone note - order faxed last week. plz check with [name] on this.

## 2013-08-10 LAB — LAB REPORT - SCANNED: B-12, serum: 487

## 2013-08-13 ENCOUNTER — Encounter: Payer: Self-pay | Admitting: Family Medicine

## 2013-08-20 ENCOUNTER — Telehealth: Payer: Self-pay

## 2013-08-20 ENCOUNTER — Encounter: Payer: Self-pay | Admitting: Family Medicine

## 2013-08-20 ENCOUNTER — Ambulatory Visit (INDEPENDENT_AMBULATORY_CARE_PROVIDER_SITE_OTHER): Payer: Medicare Other | Admitting: Family Medicine

## 2013-08-20 ENCOUNTER — Ambulatory Visit: Payer: Self-pay | Admitting: Family Medicine

## 2013-08-20 VITALS — BP 114/64 | HR 64 | Temp 97.5°F | Ht 59.5 in | Wt 156.2 lb

## 2013-08-20 DIAGNOSIS — M7989 Other specified soft tissue disorders: Secondary | ICD-10-CM

## 2013-08-20 DIAGNOSIS — E039 Hypothyroidism, unspecified: Secondary | ICD-10-CM

## 2013-08-20 DIAGNOSIS — M79609 Pain in unspecified limb: Secondary | ICD-10-CM | POA: Diagnosis not present

## 2013-08-20 DIAGNOSIS — M810 Age-related osteoporosis without current pathological fracture: Secondary | ICD-10-CM

## 2013-08-20 DIAGNOSIS — M353 Polymyalgia rheumatica: Secondary | ICD-10-CM

## 2013-08-20 DIAGNOSIS — E785 Hyperlipidemia, unspecified: Secondary | ICD-10-CM

## 2013-08-20 DIAGNOSIS — I10 Essential (primary) hypertension: Secondary | ICD-10-CM

## 2013-08-20 DIAGNOSIS — E538 Deficiency of other specified B group vitamins: Secondary | ICD-10-CM

## 2013-08-20 DIAGNOSIS — F0390 Unspecified dementia without behavioral disturbance: Secondary | ICD-10-CM

## 2013-08-20 DIAGNOSIS — D649 Anemia, unspecified: Secondary | ICD-10-CM

## 2013-08-20 DIAGNOSIS — I2510 Atherosclerotic heart disease of native coronary artery without angina pectoris: Secondary | ICD-10-CM

## 2013-08-20 DIAGNOSIS — Z0000 Encounter for general adult medical examination without abnormal findings: Secondary | ICD-10-CM

## 2013-08-20 LAB — RENAL FUNCTION PANEL
Albumin: 3.3 g/dL — ABNORMAL LOW (ref 3.5–5.2)
Chloride: 105 mEq/L (ref 96–112)
Phosphorus: 3.5 mg/dL (ref 2.3–4.6)
Potassium: 4.4 mEq/L (ref 3.5–5.1)

## 2013-08-20 LAB — CBC WITH DIFFERENTIAL/PLATELET
Basophils Absolute: 0 10*3/uL (ref 0.0–0.1)
Basophils Relative: 0.6 % (ref 0.0–3.0)
Hemoglobin: 9.2 g/dL — ABNORMAL LOW (ref 12.0–15.0)
Lymphocytes Relative: 32.1 % (ref 12.0–46.0)
Monocytes Relative: 13.3 % — ABNORMAL HIGH (ref 3.0–12.0)
Neutro Abs: 2.6 10*3/uL (ref 1.4–7.7)
RBC: 2.99 Mil/uL — ABNORMAL LOW (ref 3.87–5.11)

## 2013-08-20 LAB — SEDIMENTATION RATE: Sed Rate: 120 mm/hr — ABNORMAL HIGH (ref 0–22)

## 2013-08-20 MED ORDER — PREDNISONE 1 MG PO TABS: 2.0000 mg | ORAL_TABLET | Freq: Every day | ORAL | Status: DC

## 2013-08-20 NOTE — Telephone Encounter (Signed)
[facility] clarification of instructions for prednisone 1 mg tabs; current order is 2 tabs in AM; is this a decrease?Please advise.

## 2013-08-20 NOTE — Assessment & Plan Note (Signed)
Recheck today. 

## 2013-08-20 NOTE — Telephone Encounter (Signed)
Dr Sharen Hones said to let pt know [place] was normal;no DVT; pt may go home and elevate leg. Contact Dr Sharen Hones if leg is not better. Judeth Cornfield will advise pt.

## 2013-08-20 NOTE — Assessment & Plan Note (Addendum)
Recheck today.  This has developed since 2011.

## 2013-08-20 NOTE — Telephone Encounter (Signed)
Toniann Fail at [facility] notified as instructed.

## 2013-08-20 NOTE — Telephone Encounter (Signed)
Yes this is a decrease from prior 3mg  dose.

## 2013-08-20 NOTE — Assessment & Plan Note (Signed)
I have personally reviewed the Medicare Annual Wellness questionnaire and have noted 1. The patient's medical and social history 2. Their use of alcohol, tobacco or illicit drugs 3. Their current medications and supplements 4. The patient's functional ability including ADL's, fall risks, home safety risks and hearing or visual impairment. 5. Diet and physical activity 6. Evidence for depression or mood disorders The patients weight, height, BMI have been recorded in the chart.  Hearing and vision has been addressed. I have made referrals, counseling and provided education to the patient based review of the above and I have provided the pt with a written personalized care plan for preventive services. See scanned questionairre. Advanced directives discussed: MOST form in place.  DNR.  Son is HCPOA  Reviewed preventative protocols and updated unless pt declined. Age out of mammogram and colon cancer screening. Consider checking DEXA

## 2013-08-20 NOTE — Telephone Encounter (Signed)
Becky Adkins with [facility] Becky Adkins called report Becky Adkins lower lt extremity for leg pain and r/o DVT. Becky Adkins said negative for DVT and neg Baker's Cyst. Pt waiting.

## 2013-08-20 NOTE — Assessment & Plan Note (Signed)
Check FLP today. Continue lipitor. 

## 2013-08-20 NOTE — Assessment & Plan Note (Signed)
Chronic, stable. Continue meds. Stable period these last 6 months

## 2013-08-20 NOTE — Telephone Encounter (Signed)
Noted. Agree. Thanks.

## 2013-08-20 NOTE — Patient Instructions (Signed)
Let's decrease prednsione to 2mg  daily. Flu make sure to get flu shot this coming season Pass by Marion's office for referral for leg ultrasound to rule out blood clot. Good to see you today! Return to see [place] in 6 months for follow up. Blood work today.

## 2013-08-20 NOTE — Assessment & Plan Note (Signed)
Stable, asxs. 

## 2013-08-20 NOTE — Assessment & Plan Note (Signed)
Check TSH today

## 2013-08-20 NOTE — Assessment & Plan Note (Signed)
Chronic, stable. Continue meds. BP Readings from Last 3 Encounters:  08/20/13 114/64  01/11/13 132/72  08/15/12 124/64

## 2013-08-20 NOTE — Assessment & Plan Note (Signed)
Consider rechecking dexa in near future. Not bisphosphonate candidate 2/2 CRI.

## 2013-08-20 NOTE — Assessment & Plan Note (Signed)
Taking B12 tablet daily

## 2013-08-20 NOTE — Assessment & Plan Note (Signed)
Mild unilateral edema. Will order LLE doppler to help r/o DVT. If negative, recommend elevation of leg and update me if sxs persist or worsen.

## 2013-08-20 NOTE — Assessment & Plan Note (Signed)
Chronic, stable. Continue slow taper.  Decrease to 2mg  today.  Check ESR.

## 2013-08-20 NOTE — Progress Notes (Signed)
Subjective:    Patient ID: Becky Adkins, female    DOB: May 04, 1925, 79 y.o.   MRN: [id number]  HPI CC: medicare wellness  PMR - very stable. Has been on prednisone 3mg  daily for last 7 months.  Denies joint aches.  Undergoing slow taper. Wt Readings from Last 3 Encounters:  08/20/13 156 lb 4 oz (70.875 kg)  01/11/13 158 lb (71.668 kg)  08/15/12 163 lb (73.936 kg)   Notices some swelling in left leg - aches.  Unsure duration.  Unsure of inciting injury/ trauma  Preventative:  Colonoscopy - 2006 and normal per records/family. Aged out.  Mammogram - ordered last year but never completed.  Would like to age out.  Will do breast exam today. Tetanus - 2012  pneuovax - 2012  zostavax - 2008  Flu shot yearly. Advanced directives: does not have living will. "i've had a good life". Son is HCPOA. Would like MOST form, has signed DNR. From last visit: MOST form filled 12/2012, ok for limited interventions, ok for transfer to ER but try to avoid ICU  Does not want compressions or intubation.  Would consider short term IV fluid, abx and even feeding tube  Denies falls in last year - unsure 2/2 dementia.  Son will ask at home.  Has had walker for last 1+ year which has helped. Denies depression/anhedonia.  Failed hearing and vision screens today.  Known chronic R ear hearing loss.  Loses glasses.  Medications and allergies reviewed and updated in chart.  Past histories reviewed and updated if relevant as below. Patient Active Problem List   Diagnosis Date Noted  . Vitamin B12 deficiency 07/30/2013  . DNR (do not resuscitate)   . Deafness   . Medicare annual wellness visit, initial 08/16/2012  . Anemia 08/15/2011  . Chronic kidney disease (CKD), stage III (moderate) 08/13/2011  . Fall 07/22/2011  . Hypertension   . Polymyalgia rheumatica   . Dementia   . Personal history of TIA (transient ischemic attack)   . Macular degeneration   . GERD (gastroesophageal reflux  disease)   . Hypothyroid   . Osteoporosis   . Depression   . CAD (coronary artery disease) 03/23/2011  . Hyperlipidemia 03/23/2011  . CHEST PAIN UNSPECIFIED 09/24/2010  . History of GI bleed 12/27/2008   Past Medical History  Diagnosis Date  . Hypertension   . Hyperlipidemia   . Polymyalgia rheumatica     on prednisone, has seen neuroophthalmologist, had normal temporal artery biopsy at dx  . History of GI bleed 12/2008    w/ submucosal gastric ulcers  . Dementia     mild, significant short term memory loss, eval by memory clinic and didn't think needed further meds  . Hiatal hernia   . Personal history of TIA (transient ischemic attack) 2009    neurology w/u with normal CT head, echo, carotid US  . Anemia     s/p hematology workup, h/o B12 shots  . Macular degeneration     and cataracts  . Deafness     right ear (mumps)  . GERD (gastroesophageal reflux disease)     severe on EGD with HH, H pylori neg  . Hypothyroid     s/p thyroidectomy  . Osteoporosis     was on boniva  . 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 Migraines     none recently  . Arthritis   . Depression   . Urinary incontinence   .  CAD (coronary artery disease)     cath 12/11 at [facility]: severe 3-v CAD including LM 80% LAD 70% LCX 90% RCA 50-60%, decided medical treatment  . DNR (do not resuscitate)     MOST form filled 12/2012, ok for limited interventions, ok for transfer to ER but try to avoid ICU   Past Surgical History  Procedure Laterality Date  . Thyroidectomy      for benign tumor  . Appendectomy    . Dilation and curettage of uterus    . Hospitalization  2007    global amnesia  . Hospitalization  12/2008    bleeding ulcer, asp PNA  . Hospitalization  2011    CP, r/o x 2 (neg cardiolite and ETT)  . Esophagogastroduodenoscopy  07/2009    LA grade C reflux esophagitis, HH, single gastric nodule ([place])  . Cholecystectomy    . Colonoscopy  2006    normal  per previous records   History  Substance Use Topics  . Smoking status: Never Smoker   . Smokeless tobacco: Never Used  . Alcohol Use: No     Comment: Wine very rarely   Family History  Problem Relation Age of Onset  . Cancer Mother     cervical  . Coronary artery disease Father   . Alcohol abuse Father   . Diabetes Paternal Aunt    Allergies  Allergen Reactions  . Aricept [Donepezil Hydrochloride] Other (See Comments)    Agitated, aggressive  . Codeine     Nausea   . Penicillins     Rash    Current Outpatient Prescriptions on File Prior to Visit  Medication Sig Dispense Refill  . Acetaminophen 500 MG coapsule Take 1 capsule (500 mg total) by mouth 3 (three) times daily as needed for fever or pain.  90 capsule  0  . atorvastatin (LIPITOR) 20 MG tablet Take 20 mg by mouth daily.      . benzonatate (TESSALON PERLES) 100 MG capsule Take 1 capsule (100 mg total) by mouth 2 (two) times daily as needed for cough.  30 capsule  0  . citalopram (CELEXA) 20 MG tablet TAKE 1 & 1/2 TABLETS BY MOUTH IN THE MORNING. (DEPRESSION)  45 tablet  3  . isosorbide mononitrate (IMDUR) 30 MG 24 hr tablet Take 1 tablet (30 mg total) by mouth 2 (two) times daily.  60 tablet  6  . levothyroxine (SYNTHROID, LEVOTHROID) 50 MCG tablet Take one daily by mouth, but take two pills on Wednesday and Sunday.  40 tablet  11  . losartan (COZAAR) 50 MG tablet Take 1 tablet (50 mg total) by mouth daily.      . metoprolol tartrate (LOPRESSOR) 25 MG tablet Take 25 mg by mouth 2 (two) times daily.        . Multiple Vitamins-Minerals (PRESERVISION AREDS 2 PO) Take 1 capsule by mouth 2 (two) times daily.        . nitroGLYCERIN (NITROSTAT) 0.4 MG SL tablet Place 0.4 mg under the tongue every 5 (five) minutes as needed.        . pantoprazole (PROTONIX) 40 MG tablet Take 40 mg by mouth daily.        . traZODone (DESYREL) 50 MG tablet TAKE 1/2 TABLET BY MOUTH EACH NIGHT AT BEDTIME. (SLEEP/DEPRESSION)  15 tablet  6  .  aspirin 81 MG EC tablet Take 81 mg by mouth daily.        . cyanocobalamin (,VITAMIN B-12,) 1000 MCG/ML injection Inject 1,000  mcg into the muscle every 30 (thirty) days.       No current facility-administered medications on file prior to visit.    Review of Systems     Objective:   Physical Exam  Nursing note and vitals reviewed. Constitutional: She is oriented to person, place, and time. She appears well-developed and well-nourished. No distress.  HENT:  Head: Normocephalic and atraumatic.  Right Ear: External ear normal.  Left Ear: External ear normal.  Nose: Nose normal.  Mouth/Throat: Oropharynx is clear and moist. No oropharyngeal exudate.  Eyes: Conjunctivae and EOM are normal. Pupils are equal, round, and reactive to light. No scleral icterus.  Neck: Normal range of motion. Neck supple. Carotid bruit is not present. No thyromegaly present.  Cardiovascular: Normal rate, regular rhythm, normal heart sounds and intact distal pulses.   No murmur heard. Pulses:      Radial pulses are 2+ on the right side, and 2+ on the left side.  Pulmonary/Chest: Effort normal and breath sounds normal. No respiratory distress. She has no wheezes. She has no rales. Right breast exhibits no inverted nipple, no mass, no nipple discharge, no skin change and no tenderness. Left breast exhibits no inverted nipple, no mass, no nipple discharge, no skin change and no tenderness. Breasts are symmetrical.  Abdominal: Soft. Bowel sounds are normal. She exhibits no distension and no mass. There is no tenderness. There is no rebound and no guarding.  Musculoskeletal: Normal range of motion. She exhibits edema (tender nonpitting).  L calf circ 36.5cm R calf circ 35.5cm No palpable cords  Lymphadenopathy:    She has no cervical adenopathy.    She has no axillary adenopathy.       Right axillary: No lateral adenopathy present.       Left axillary: No lateral adenopathy present.      Right: No supraclavicular  adenopathy present.       Left: No supraclavicular adenopathy present.  Neurological: She is alert and oriented to person, place, and time.  CN grossly intact, station and gait intact  Skin: Skin is warm and dry. No rash noted.  Psychiatric: She has a normal mood and affect. Her behavior is normal. Judgment and thought content normal.       Assessment & Plan:

## 2013-08-21 ENCOUNTER — Encounter: Payer: Self-pay | Admitting: Family Medicine

## 2013-08-21 ENCOUNTER — Other Ambulatory Visit: Payer: Self-pay | Admitting: Family Medicine

## 2013-08-23 ENCOUNTER — Other Ambulatory Visit: Payer: Self-pay | Admitting: Family Medicine

## 2013-09-07 ENCOUNTER — Encounter: Payer: Self-pay | Admitting: Family Medicine

## 2013-09-11 ENCOUNTER — Other Ambulatory Visit: Payer: Self-pay | Admitting: Family Medicine

## 2013-10-02 ENCOUNTER — Other Ambulatory Visit: Payer: Self-pay | Admitting: *Deleted

## 2013-10-02 MED ORDER — LEVOTHYROXINE SODIUM 50 MCG PO TABS: ORAL_TABLET | ORAL | Status: DC

## 2013-10-05 ENCOUNTER — Other Ambulatory Visit: Payer: Self-pay | Admitting: Family Medicine

## 2013-10-05 DIAGNOSIS — Z23 Encounter for immunization: Secondary | ICD-10-CM | POA: Diagnosis not present

## 2013-10-05 NOTE — Telephone Encounter (Signed)
Ok to refill 

## 2013-10-08 ENCOUNTER — Telehealth: Payer: Self-pay

## 2013-10-08 NOTE — Telephone Encounter (Signed)
Pt daughter in law called concerned about pt, states yesterday, pt was very SOB, and angina just taking her clothes off. Pt states this happens frequently. Daughter thinks maybe her medication needs to be changed.? States symptoms stop with rest. Please call.

## 2013-10-08 NOTE — Telephone Encounter (Signed)
Spoke w/ Diane, pt's daughter-in-law.   She states that pt is having "her angina flare up in her back" and she inquired as to pt's last visit to the office.   I informed her that the last time pt saw Dr. Mariah Milling was 11/15/13. Diane stated that was incorrect, as "my husband has brought her up there several times since then." Informed her that pt has records from other offices, but has not been seen specifically by Dr. Mariah Milling in this office since Nov 2012. She stated that "since you don't have any records, I guess we're done and you don't want to take care of her". Informed her that I would be happy to schedule her an appt with Dr. Mariah Milling tomorrow. Diane stated that "if you have lost all her records, I don't have a lot of confidence in your office." She stated that she will call her husband to verify that the "appointments line up", check w/ pt's PCP and call back if she feels pt needs an evaluation.

## 2013-10-09 ENCOUNTER — Telehealth: Payer: Self-pay

## 2013-10-09 DIAGNOSIS — I2510 Atherosclerotic heart disease of native coronary artery without angina pectoris: Secondary | ICD-10-CM

## 2013-10-09 DIAGNOSIS — R079 Chest pain, unspecified: Secondary | ICD-10-CM

## 2013-10-09 NOTE — Telephone Encounter (Signed)
Blood pressure has been stable last few readings here.  She's already on imdur twice daily. I recommend re establish with cardiologist.  Prior to this I'd like them to have blood work checked to check for worsening anemia leading to angina, and she's due for chol level check. May order here or have drawn at [facility]. Fasting lipid panel (272.4) and CBC and ferritin (285.9).

## 2013-10-09 NOTE — Telephone Encounter (Signed)
Spoke with patient's DIL and she would like to go ahead and have cards eval ASAP. She requested labs to be drawn at [facility]. Order faxed.

## 2013-10-09 NOTE — Telephone Encounter (Signed)
Becky Adkins pts daughter in law left v/m; pt is stable now but  having increase in angina (takes less to bring angina on) pt has not seen cardiologist in 2 years and prior to making appt with Dr Reece Agar or cardiologist,Diana wants to know if Dr Reece Agar feels comfortable to adjust pt's medication or should appt be made with cardiologist.Please advise.

## 2013-10-09 NOTE — Telephone Encounter (Signed)
I have placed referral for cardiology eval for worsening angina to see if pt can be seen this week.

## 2013-10-10 ENCOUNTER — Other Ambulatory Visit: Payer: Self-pay | Admitting: Family Medicine

## 2013-10-10 NOTE — Telephone Encounter (Signed)
Appt made at [facility] tomorrow, 10/11/13 at 1pm with Hurman Horn P.A. DIL notified.

## 2013-10-11 ENCOUNTER — Encounter: Payer: Self-pay | Admitting: Physician Assistant

## 2013-10-11 ENCOUNTER — Ambulatory Visit (INDEPENDENT_AMBULATORY_CARE_PROVIDER_SITE_OTHER): Payer: Medicare Other | Admitting: Physician Assistant

## 2013-10-11 VITALS — BP 112/54 | HR 68 | Ht 61.0 in | Wt 161.8 lb

## 2013-10-11 DIAGNOSIS — I208 Other forms of angina pectoris: Secondary | ICD-10-CM

## 2013-10-11 DIAGNOSIS — D649 Anemia, unspecified: Secondary | ICD-10-CM | POA: Diagnosis not present

## 2013-10-11 DIAGNOSIS — I10 Essential (primary) hypertension: Secondary | ICD-10-CM

## 2013-10-11 DIAGNOSIS — I2510 Atherosclerotic heart disease of native coronary artery without angina pectoris: Secondary | ICD-10-CM

## 2013-10-11 DIAGNOSIS — R0602 Shortness of breath: Secondary | ICD-10-CM

## 2013-10-11 DIAGNOSIS — M549 Dorsalgia, unspecified: Secondary | ICD-10-CM

## 2013-10-11 DIAGNOSIS — I209 Angina pectoris, unspecified: Secondary | ICD-10-CM | POA: Insufficient documentation

## 2013-10-11 DIAGNOSIS — E785 Hyperlipidemia, unspecified: Secondary | ICD-10-CM

## 2013-10-11 MED ORDER — RANOLAZINE ER 500 MG PO TB12: 500.0000 mg | ORAL_TABLET | Freq: Two times a day (BID) | ORAL | Status: DC

## 2013-10-11 MED ORDER — AMLODIPINE BESYLATE 2.5 MG PO TABS: 2.5000 mg | ORAL_TABLET | Freq: Every day | ORAL | Status: DC

## 2013-10-11 NOTE — Assessment & Plan Note (Signed)
Review lipid panel. Continue statin.

## 2013-10-11 NOTE — Assessment & Plan Note (Signed)
Monitor BP with antihypertensive and antianginal adjustments.

## 2013-10-11 NOTE — Assessment & Plan Note (Addendum)
The patient has been experiencing worsening angina. Each episode lasts < 20 min and is relieved with rest. CCS class II->III symptoms. She has known severe 3V CAD. Angina had been adequately managed on Imdur 30 BID and Lopressor 25 BID. Discussing options with patient and her daughter-in-law, would like to avoid PCI due GIBs in the past and avoid bypass with underlying dementia. That patient's blood pressure is labile for her age. Want DBP >60 to promote coronary perfusion. Hold losartan, add amlodipine 2.5mg . Will also add Ranexa 500 BID. Check EKG on follow-up in 1 month. QTc 434 today. Avoid QT prolonging drugs. Check CBC that was drawn today. Would prefer to keep hgb > 10.

## 2013-10-11 NOTE — Patient Instructions (Addendum)
We will see you back in 1 month.   Please take amlodipine and ranolazine as prescribed.   We will check an EKG when you follow-up with myself or Dr. Mariah Milling in 1 month.   Please elevate your legs or wear compression stockings for swelling in your left leg.

## 2013-10-11 NOTE — Assessment & Plan Note (Signed)
Cr 1.3, GFR 40 in 07/2013. Will hold ARB to allow for more BP with underlying CAD and angina.

## 2013-10-11 NOTE — Assessment & Plan Note (Signed)
Treating angina with antianginals as above. Continue ASA, BB, statin, Imdur, NTG SL PRN. No Plavix with h/o GIBs. Will review lipid panel.

## 2013-10-11 NOTE — Progress Notes (Signed)
Patient ID: Becky Adkins, female   DOB: 31-Oct-1925, 79 y.o.   MRN: [id number]   Date:  10/11/2013   ID:  Becky Adkins, DOB [birth year], MRN [id number]  PCP:  Becky Boyden, MD  Primary Cardiologist:  Becky Se, MD   History of Present Illness:  Becky Adkins is a very pleasant 79 y.o. female last seen in 10/2011. She has a history of 3V CAD treated medically, dementia, HTN, HLD, PMR (on chronic prednisone), hypothyroidism, h/o GIB/PUD, GERD, hiatal hernia, h/o normocytic anemia and h/o TIA who presents today c/o worsening angina.   She underwent cardiac catheterization in 11/2010 at [facility] revealing 80% left main, 70% mid LAD, 90% mid LCx, 60% prox RCA, 50% dis RCA, 90% ostial PLB.   Echo 11/2010- EF > 55%, mild LVH, mild LA dilatation, mildly elevated RVSP (30-40 mmHg), mild TR.   Per prior office note, she was transferred to [facility] to consider CABG vs LM stent. There was suspicion of compromised sternotomy healing with chronic prednisone use. In addition, she had a GIB in 12/2009, and concern was raised for recurrent GIB with PCI requiring DAPT. With these concerns and given her comorbidities, the decision was made to treat medically.   She is a resident of [facility]. [name]' daughter-in-law accompanies her today. She has had stable angina since then- CCS class II- on Imdur 30 BID and Lopressor 25 BID. Anginal equivalent is back discomfort and dyspnea. She is able to walk 2 blocks w/o incident. Over the past week, per her daughter-in-law, she has experienced similar back discomfort and dyspnea related to angina when walking, and most recently with only putting on a sweatshirt. Each episode lasts < 20 minutes and is relieved with several minutes of rest. BPs have been 114-132/64-72 on three prior primary care visits.   TSH normal at 1.01 in 07/2013.  Hgb 9.2/Hct 27.5, MCV 91.9 in 07/2013   CBC and lipid panel was checked today by her PCP. Results  pending.   EKG: NSR, occasional PACs, <0.5 mm ST scooping V3-V6, small Q in III, QTc 434  Wt Readings from Last 3 Encounters:  08/20/13 156 lb 4 oz (70.875 kg)  01/11/13 158 lb (71.668 kg)  08/15/12 163 lb (73.936 kg)     Past Medical History  Diagnosis Date  . Hypertension   . Hyperlipidemia   . Polymyalgia rheumatica     on prednisone, has seen neuroophthalmologist, had normal temporal artery biopsy at dx  . History of GI bleed 12/2008    w/ submucosal gastric ulcers  . Dementia     mild, significant short term memory loss, eval by memory clinic and didn't think needed further meds  . Hiatal hernia   . Personal history of TIA (transient ischemic attack) 2009    neurology w/u with normal CT head, echo, carotid US  . Anemia     s/p hematology workup, h/o B12 shots  . Macular degeneration     and cataracts  . Deafness     right ear (mumps)  . GERD (gastroesophageal reflux disease)     severe on EGD with HH, H pylori neg  . Hypothyroid     s/p thyroidectomy  . Osteoporosis     was on boniva  . 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 Migraines     none recently  . Arthritis   . Depression   . Urinary incontinence   . CAD (coronary artery  disease)     cath 12/11 at [facility]: severe 3-v CAD including LM 80% LAD 70% LCX 90% RCA 50-60%, decided medical treatment  . DNR (do not resuscitate)     MOST form filled 12/2012, ok for limited interventions, ok for transfer to ER but try to avoid ICU    Current Outpatient Prescriptions  Medication Sig Dispense Refill  . Acetaminophen 500 MG coapsule Take 1 capsule (500 mg total) by mouth 3 (three) times daily as needed for fever or pain.  90 capsule  0  . aspirin 81 MG EC tablet Take 81 mg by mouth daily.        Marland Kitchen atorvastatin (LIPITOR) 20 MG tablet TAKE ONE TABLET BY MOUTH EACH DAY AT HS . (IMPROVES CHOLESTEROL)  30 tablet  6  . benzonatate (TESSALON PERLES) 100 MG capsule Take 1 capsule (100 mg total)  by mouth 2 (two) times daily as needed for cough.  30 capsule  0  . citalopram (CELEXA) 20 MG tablet TAKE 1 & 1/2 TABLETS BY MOUTH IN THE MORNING. (DEPRESSION)  45 tablet  6  . isosorbide mononitrate (IMDUR) 30 MG 24 hr tablet Take 1 tablet (30 mg total) by mouth 2 (two) times daily.  60 tablet  6  . levothyroxine (SYNTHROID, LEVOTHROID) 100 MCG tablet TAKE ONE TABLET BY MOUTH TWICE WEEKLY ON WEDNESDAYS AND SUNDAYS. (THYROID) TAKE ON EMPTY STOMACH.  8 tablet  6  . levothyroxine (SYNTHROID, LEVOTHROID) 50 MCG tablet Take one daily by mouth, but take two pills on Wednesday and Sunday.  40 tablet  11  . metoprolol tartrate (LOPRESSOR) 25 MG tablet Take 25 mg by mouth 2 (two) times daily.        . Multiple Vitamins-Minerals (PRESERVISION AREDS 2 PO) Take 1 capsule by mouth 2 (two) times daily.        . Multiple Vitamins-Minerals (PRESERVISION AREDS) TABS TAKE ONE TABLET BY MOUTH ONCE DAILY IN THE AM FOR SUPPLEMENT  30 tablet  3  . nitroGLYCERIN (NITROSTAT) 0.4 MG SL tablet Place 0.4 mg under the tongue every 5 (five) minutes as needed.        . pantoprazole (PROTONIX) 40 MG tablet Take 40 mg by mouth daily.        . predniSONE (DELTASONE) 1 MG tablet Take 2 tablets (2 mg total) by mouth daily. For polymyalgia rhuematrica  60 tablet  11  . traZODone (DESYREL) 50 MG tablet TAKE 1/2 TABLET BY MOUTH EACH NIGHT AT BEDTIME. (SLEEP/DEPRESSION)  15 tablet  3  . vitamin B-12 (CYANOCOBALAMIN) 1000 MCG tablet TAKE ONE TABLET BY MOUTH EACH DAY.  30 tablet  3  . amLODipine (NORVASC) 2.5 MG tablet Take 1 tablet (2.5 mg total) by mouth daily.  180 tablet  3  . ranolazine (RANEXA) 500 MG 12 hr tablet Take 1 tablet (500 mg total) by mouth 2 (two) times daily.  60 tablet  3   No current facility-administered medications for this visit.    Allergies:    Allergies  Allergen Reactions  . Aricept [Donepezil Hydrochloride] Other (See Comments)    Agitated, aggressive  . Codeine     Nausea   . Penicillins     Rash      Social History:  The patient  reports that she has never smoked. She has never used smokeless tobacco. She reports that she does not drink alcohol or use illicit drugs.   Family History:  Family History  Problem Relation Age of Onset  . Cancer Mother  cervical  . Coronary artery disease Father   . Alcohol abuse Father   . Diabetes Paternal Aunt     Review of Systems: General: negative for chills, fever, night sweats or weight changes.  Cardiovascular: positive for back pain, shortness of breath, negative for dyspnea on exertion, edema, orthopnea, palpitations, paroxysmal nocturnal dyspnea Dermatological: negative for rash Respiratory: negative for cough or wheezing Urologic: negative for hematuria Abdominal: negative for nausea, vomiting, diarrhea, bright red blood per rectum, melena, or hematemesis Neurologic: negative for visual changes, syncope, or dizziness All other systems reviewed and are otherwise negative except as noted above.  PHYSICAL EXAM: VS:  BP 112/54  Pulse 68  Ht 5\' 1"  (1.549 m)  Wt 161 lb 12 oz (73.369 kg)  BMI 30.58 kg/m2 Well nourished, well developed, in no acute distress HEENT: normal, PERRL Neck: no JVD or bruits Cardiac:  normal S1, S2; RRR; no murmur or gallops Lungs:  clear to auscultation bilaterally, no wheezing, rhonchi or rales Abd: soft, nontender, no hepatomegaly, normoactive BS x 4 quads Ext: LLE nonpitting edema, + tenderness on palpation, no edema, cyanosis or clubbing Skin: warm and dry, cap refill < 2 sec Neuro:  CNs 2-12 intact, no focal abnormalities noted Musculoskeletal: strength and tone appropriate for age  Psych: normal affect

## 2013-10-12 ENCOUNTER — Encounter: Payer: Self-pay | Admitting: Family Medicine

## 2013-10-15 ENCOUNTER — Other Ambulatory Visit: Payer: Self-pay | Admitting: Family Medicine

## 2013-10-16 ENCOUNTER — Telehealth: Payer: Self-pay

## 2013-10-16 NOTE — Telephone Encounter (Signed)
Spoke w/ pt's daughter in law who states that pt does not have chest pain, "her pain is angina in her back." States that pt does not live with her and she has not received a call from the nursing home this week, so "she must be good." She is to call if pt's symptoms are not resolved or if they have any questions or concerns.

## 2013-10-16 NOTE — Telephone Encounter (Signed)
Message copied by Marilynne Halsted on Tue Oct 16, 2013  5:01 PM ------      Message from: Odella Aquas A      Created: Tue Oct 16, 2013 10:21 AM       CBC reviewed, patient remains anemic, normocytic, with Hgb 9.2/Hct 27.9. To discuss hem referral +/- transfusions with patient & family. Lipid panel- LDL 91, HDL 40, TG 132, TC 157. Per ACC/AHA guidelines, there is no clear support of high-intensity statin in patients > 77 yo for secondary prevention. She has tolerated moderate-intensity atorvastatin 20mg . Will continue this. Can we please call patient's daughter-in-law, Sedalia Muta, to see if angina has improved since starting Ranexa and Norvasc? ------

## 2013-10-24 ENCOUNTER — Other Ambulatory Visit: Payer: Self-pay | Admitting: Family Medicine

## 2013-10-29 ENCOUNTER — Other Ambulatory Visit: Payer: Self-pay | Admitting: Family Medicine

## 2013-11-01 ENCOUNTER — Other Ambulatory Visit: Payer: Self-pay | Admitting: Family Medicine

## 2013-11-01 ENCOUNTER — Other Ambulatory Visit: Payer: Self-pay

## 2013-11-12 ENCOUNTER — Ambulatory Visit: Payer: Medicare Other | Admitting: Cardiovascular Disease

## 2013-11-13 ENCOUNTER — Ambulatory Visit (INDEPENDENT_AMBULATORY_CARE_PROVIDER_SITE_OTHER): Payer: Medicare Other | Admitting: Cardiovascular Disease

## 2013-11-13 ENCOUNTER — Encounter: Payer: Self-pay | Admitting: Cardiovascular Disease

## 2013-11-13 VITALS — BP 100/64 | HR 65 | Ht 61.5 in | Wt 159.8 lb

## 2013-11-13 DIAGNOSIS — E611 Iron deficiency: Secondary | ICD-10-CM

## 2013-11-13 DIAGNOSIS — D509 Iron deficiency anemia, unspecified: Secondary | ICD-10-CM

## 2013-11-13 DIAGNOSIS — I2510 Atherosclerotic heart disease of native coronary artery without angina pectoris: Secondary | ICD-10-CM

## 2013-11-13 DIAGNOSIS — I209 Angina pectoris, unspecified: Secondary | ICD-10-CM

## 2013-11-13 DIAGNOSIS — D649 Anemia, unspecified: Secondary | ICD-10-CM

## 2013-11-13 DIAGNOSIS — E785 Hyperlipidemia, unspecified: Secondary | ICD-10-CM

## 2013-11-13 NOTE — Assessment & Plan Note (Signed)
Suggested she stay on her generic Lipitor. 

## 2013-11-13 NOTE — Patient Instructions (Addendum)
You are doing well. No medication changes were made.  We will check CBC and iron/iron saturation/ferritin level  Please discontinue losartan  If angina gets worse, we could increase ranexa Ok to take nitro SL for back/anginal pain  Please call us if you have new issues that need to be addressed before your next appt.  Your physician wants you to follow-up in: 6 months.  You will receive a reminder letter in the mail two months in advance. If you don't receive a letter, please call our office to schedule the follow-up appointment.

## 2013-11-13 NOTE — Assessment & Plan Note (Addendum)
Son is concerned about her anemia. CBC drawn today with iron studies. Family was concerned about her restarting iron pills as there may have been a problem in the past

## 2013-11-13 NOTE — Assessment & Plan Note (Signed)
Currently with no symptoms of angina. No further workup at this time. Continue current medication regimen. 

## 2013-11-13 NOTE — Progress Notes (Signed)
Patient ID: Becky Adkins, female    DOB: 1925-11-15, 79 y.o.   MRN: [id number]  HPI Comments: Becky Adkins is a very pleasant 79 y.o. female  with a history of 3V CAD treated medically, dementia, HTN, HLD, PMR (on chronic prednisone), hypothyroidism, h/o GIB/PUD, GERD, hiatal hernia, h/o normocytic anemia and h/o TIA who presents today for routine followup. Recent history of angina.   cardiac catheterization in 11/2010 at [facility] revealing 80% left main, 70% mid LAD, 90% mid LCx, 60% prox RCA, 50% dis RCA, 90% ostial PLB.  Echo 11/2010- EF > 55%, mild LVH, mild LA dilatation, mildly elevated RVSP (30-40 mmHg), mild TR.    transferred to [facility] to consider CABG vs LM stent.  Concern of sternotomy healing with chronic prednisone use. GIB in 12/2009,  recurrent GIB with PCI requiring DAPT.  the decision was made to treat medically.   She is a resident of [facility]. She was recently started on isosorbide 30 mg twice a day. Also started on ranexa 500 mg twice a day.  Back pain, which is felt to be her anginal equivalent, has improved. Overall she is relatively happy.  She is able to walk 2 blocks w/o incident. She has not needed nitroglycerin for chest pain symptoms. Losartan was not stopped at her skilled nursing facility  TSH normal at 1.01 in 07/2013.  Hgb 9.2/Hct 27.5, MCV 91.9 in 07/2013      Outpatient Encounter Prescriptions as of 11/13/2013  Medication Sig  . Acetaminophen 500 MG coapsule Take 1 capsule (500 mg total) by mouth 3 (three) times daily as needed for fever or pain.  Marland Kitchen amLODipine (NORVASC) 2.5 MG tablet Take 1 tablet (2.5 mg total) by mouth daily.  Marland Kitchen aspirin 81 MG EC tablet TAKE 1 TABLET BY MOUTH EACH DAY. ANTI PLATELET AGGREGATION  . atorvastatin (LIPITOR) 20 MG tablet TAKE ONE TABLET BY MOUTH EACH DAY AT HS . (IMPROVES CHOLESTEROL)  . benzonatate (TESSALON PERLES) 100 MG capsule Take 1 capsule (100 mg total) by mouth 2 (two) times daily as needed for cough.   . citalopram (CELEXA) 20 MG tablet TAKE 1 & 1/2 TABLETS BY MOUTH IN THE MORNING. (DEPRESSION)  . isosorbide mononitrate (IMDUR) 30 MG 24 hr tablet TAKE ONE TABLET BY MOUTH TWICE DAILY FOR HEART.  Marland Kitchen levothyroxine (SYNTHROID, LEVOTHROID) 100 MCG tablet TAKE ONE TABLET BY MOUTH TWICE WEEKLY ON WEDNESDAYS AND SUNDAYS. (THYROID) TAKE ON EMPTY STOMACH.  Marland Kitchen levothyroxine (SYNTHROID, LEVOTHROID) 50 MCG tablet Take one daily by mouth, but take two pills on Wednesday and Sunday.  . losartan (COZAAR) 50 MG tablet TAKE ONE TABLET BY MOUTH EACH MORNING FOR BLOOD PRESSURE CONTROL.  Marland Kitchen metoprolol tartrate (LOPRESSOR) 25 MG tablet ONE TABLET BY MOUTH EVERY 12 HOURS FOR HIGH BLOOD PRESSURE.  . Multiple Vitamins-Minerals (PRESERVISION AREDS 2 PO) Take 1 capsule by mouth 2 (two) times daily.    . Multiple Vitamins-Minerals (PRESERVISION AREDS) TABS TAKE ONE TABLET BY MOUTH ONCE DAILY IN THE AM FOR SUPPLEMENT  . nitroGLYCERIN (NITROSTAT) 0.4 MG SL tablet Place 0.4 mg under the tongue every 5 (five) minutes as needed.    . pantoprazole (PROTONIX) 40 MG tablet TAKE 1 TABLET BY MOUTH EACH MORNING. (GERD) **DO NOT CRUSH**  . predniSONE (DELTASONE) 1 MG tablet Take 2 tablets (2 mg total) by mouth daily. For polymyalgia rhuematrica  . ranolazine (RANEXA) 500 MG 12 hr tablet Take 1 tablet (500 mg total) by mouth 2 (two) times daily.  . traZODone (DESYREL) 50  MG tablet TAKE 1/2 TABLET BY MOUTH EACH NIGHT AT BEDTIME. (SLEEP/DEPRESSION)  . vitamin B-12 (CYANOCOBALAMIN) 1000 MCG tablet TAKE ONE TABLET BY MOUTH EACH DAY.    Review of Systems  Constitutional: Negative.   HENT: Negative.   Eyes: Negative.   Respiratory: Negative.   Cardiovascular: Negative.   Gastrointestinal: Negative.   Endocrine: Negative.   Musculoskeletal: Negative.   Skin: Negative.   Allergic/Immunologic: Negative.   Neurological: Negative.   Hematological: Negative.   Psychiatric/Behavioral: Negative.   All other systems reviewed and are  negative.    BP 100/64  Pulse 65  Ht 5' 1.5" (1.562 m)  Wt 159 lb 12 oz (72.462 kg)  BMI 29.70 kg/m2  Physical Exam  Nursing note and vitals reviewed. Constitutional: She is oriented to person, place, and time. She appears well-developed and well-nourished.  HENT:  Head: Normocephalic.  Nose: Nose normal.  Mouth/Throat: Oropharynx is clear and moist.  Eyes: Conjunctivae are normal. Pupils are equal, round, and reactive to light.  Neck: Normal range of motion. Neck supple. No JVD present.  Cardiovascular: Normal rate, regular rhythm, S1 normal, S2 normal and intact distal pulses.  Exam reveals no gallop and no friction rub.   Murmur heard.  Systolic murmur is present with a grade of 2/6  Pulmonary/Chest: Effort normal and breath sounds normal. No respiratory distress. She has no wheezes. She has no rales. She exhibits no tenderness.  Abdominal: Soft. Bowel sounds are normal. She exhibits no distension. There is no tenderness.  Musculoskeletal: Normal range of motion. She exhibits no edema and no tenderness.  Lymphadenopathy:    She has no cervical adenopathy.  Neurological: She is alert and oriented to person, place, and time. Coordination normal.  Skin: Skin is warm and dry. No rash noted. No erythema.  Psychiatric: She has a normal mood and affect. Her behavior is normal. Judgment and thought content normal.    Assessment and Plan

## 2013-11-13 NOTE — Assessment & Plan Note (Addendum)
Improved symptoms compared to her last clinic visit. Long discussion with her son today. We will hold the losartan as blood pressure is low. Check blood work as she does appear pale and has a history of anemia. Otherwise she is doing well symptomatically and no medication changes were made

## 2013-11-14 LAB — CBC
HCT: 28.3 % — ABNORMAL LOW (ref 34.0–46.6)
Hemoglobin: 9.1 g/dL — ABNORMAL LOW (ref 11.1–15.9)
MCHC: 32.2 g/dL (ref 31.5–35.7)
MCV: 93 fL (ref 79–97)
RBC: 3.03 x10E6/uL — ABNORMAL LOW (ref 3.77–5.28)
RDW: 15.2 % (ref 12.3–15.4)
WBC: 5.4 10*3/uL (ref 3.4–10.8)

## 2013-11-14 LAB — IRON AND TIBC
TIBC: 299 ug/dL (ref 250–450)
UIBC: 257 ug/dL (ref 150–375)

## 2013-11-14 LAB — FERRITIN: Ferritin: 35 ng/mL (ref 15–150)

## 2013-11-19 ENCOUNTER — Telehealth: Payer: Self-pay

## 2013-11-19 NOTE — Telephone Encounter (Signed)
Message copied by Marilynne Halsted on Mon Nov 19, 2013 11:19 AM ------      Message from: Antonieta Iba      Created: Sun Nov 18, 2013  9:50 PM       Still moderately anemia but stable      Low nomal is 35, patient is 30      could add iron pill every other day, foods high in iron ------

## 2013-11-19 NOTE — Telephone Encounter (Signed)
Spoke w/ Trinna Post.  Informed her that pt is to discontinue losartan, per pt's last ov.  She would like an order faxed over.  Sent new order to [phone].

## 2013-11-19 NOTE — Telephone Encounter (Signed)
Needs to know how long to hold pt Losartan. Please call and advise.

## 2013-11-20 NOTE — Telephone Encounter (Signed)
Message copied by Marilynne Halsted on Tue Nov 20, 2013  9:20 AM ------      Message from: Antonieta Iba      Created: Sun Nov 18, 2013  9:50 PM       Still moderately anemia but stable      Low nomal is 35, patient is 30      could add iron pill every other day, foods high in iron ------

## 2013-11-20 NOTE — Telephone Encounter (Signed)
Sent fax to [facility] with lab results and instructions.

## 2013-12-17 ENCOUNTER — Telehealth: Payer: Self-pay

## 2013-12-17 NOTE — Telephone Encounter (Signed)
Linda with [facility] had question about Losartan being stopped on 11/13/13. Bonita Quin will call Dr Windell Hummingbird office.

## 2013-12-17 NOTE — Telephone Encounter (Signed)
Pt caretaker states pt was told to hold Losartan, not sure how long pt should be off, states has not been taking it the whole month of November. Please call and advise if pt should start taking or D/C the medication.

## 2013-12-17 NOTE — Telephone Encounter (Signed)
Left voicemail informing Bonita Quin that Dr. Mariah Milling discontinued pt's Losartan on last ov 11/13/13. Asked her to call w/ any questions or concerns.

## 2013-12-17 NOTE — Telephone Encounter (Signed)
Linda left message on voicemail stating that she never received order to d/c pt's losartan. Faxed last ov w/ written patient instructions to d/c losartan effective 11/13/13 to [phone].

## 2014-01-14 ENCOUNTER — Other Ambulatory Visit: Payer: Self-pay | Admitting: Family Medicine

## 2014-02-06 ENCOUNTER — Other Ambulatory Visit: Payer: Self-pay | Admitting: Family Medicine

## 2014-02-11 ENCOUNTER — Other Ambulatory Visit: Payer: Self-pay

## 2014-02-11 MED ORDER — RANOLAZINE ER 500 MG PO TB12: 500.0000 mg | ORAL_TABLET | Freq: Two times a day (BID) | ORAL | Status: DC

## 2014-02-11 NOTE — Telephone Encounter (Signed)
Refill sent for ranexa 500 mg take one tablet bid

## 2014-02-18 ENCOUNTER — Other Ambulatory Visit: Payer: Self-pay | Admitting: *Deleted

## 2014-02-18 MED ORDER — PRESERVISION AREDS PO TABS: ORAL_TABLET | ORAL | Status: DC

## 2014-02-18 NOTE — Telephone Encounter (Signed)
Received faxed refill request from pharmacy. Refill sent to pharmacy electronically. 

## 2014-02-19 ENCOUNTER — Other Ambulatory Visit: Payer: Self-pay | Admitting: Family Medicine

## 2014-02-19 MED ORDER — PRESERVISION AREDS PO TABS: ORAL_TABLET | ORAL | Status: DC

## 2014-02-20 ENCOUNTER — Other Ambulatory Visit: Payer: Self-pay | Admitting: *Deleted

## 2014-02-20 MED ORDER — ISOSORBIDE MONONITRATE ER 30 MG PO TB24: ORAL_TABLET | ORAL | Status: DC

## 2014-02-20 NOTE — Telephone Encounter (Signed)
Diane pts daughter in law left v/m requesting status of preservision refill. Advised done by v/m on [phone].

## 2014-03-08 ENCOUNTER — Other Ambulatory Visit: Payer: Self-pay | Admitting: Family Medicine

## 2014-10-03 DIAGNOSIS — Z23 Encounter for immunization: Secondary | ICD-10-CM | POA: Diagnosis not present

## 2014-11-08 ENCOUNTER — Telehealth (HOSPITAL_COMMUNITY): Payer: Self-pay | Admitting: *Deleted

## 2014-11-08 NOTE — Telephone Encounter (Signed)
Noted agree

## 2014-11-08 NOTE — Telephone Encounter (Signed)
Left message for pt to schedule for AWV since she is due for one.

## 2014-11-12 NOTE — Telephone Encounter (Signed)
LVM for pt to call back.   RE: needs to schedule AWV for 2015

## 2014-12-09 ENCOUNTER — Encounter: Payer: Self-pay | Admitting: Family Medicine

## 2014-12-09 ENCOUNTER — Ambulatory Visit (INDEPENDENT_AMBULATORY_CARE_PROVIDER_SITE_OTHER): Payer: Medicare Other | Admitting: Family Medicine

## 2014-12-09 VITALS — BP 110/56 | HR 68 | Temp 97.9°F | Ht 62.0 in | Wt 150.8 lb

## 2014-12-09 DIAGNOSIS — F32A Depression, unspecified: Secondary | ICD-10-CM

## 2014-12-09 DIAGNOSIS — E785 Hyperlipidemia, unspecified: Secondary | ICD-10-CM

## 2014-12-09 DIAGNOSIS — D649 Anemia, unspecified: Secondary | ICD-10-CM

## 2014-12-09 DIAGNOSIS — Z7189 Other specified counseling: Secondary | ICD-10-CM | POA: Insufficient documentation

## 2014-12-09 DIAGNOSIS — N183 Chronic kidney disease, stage 3 unspecified: Secondary | ICD-10-CM

## 2014-12-09 DIAGNOSIS — E538 Deficiency of other specified B group vitamins: Secondary | ICD-10-CM

## 2014-12-09 DIAGNOSIS — M353 Polymyalgia rheumatica: Secondary | ICD-10-CM

## 2014-12-09 DIAGNOSIS — F329 Major depressive disorder, single episode, unspecified: Secondary | ICD-10-CM

## 2014-12-09 DIAGNOSIS — Z0000 Encounter for general adult medical examination without abnormal findings: Secondary | ICD-10-CM

## 2014-12-09 DIAGNOSIS — E039 Hypothyroidism, unspecified: Secondary | ICD-10-CM

## 2014-12-09 DIAGNOSIS — Z23 Encounter for immunization: Secondary | ICD-10-CM

## 2014-12-09 DIAGNOSIS — Z66 Do not resuscitate: Secondary | ICD-10-CM

## 2014-12-09 DIAGNOSIS — R42 Dizziness and giddiness: Secondary | ICD-10-CM

## 2014-12-09 DIAGNOSIS — I10 Essential (primary) hypertension: Secondary | ICD-10-CM

## 2014-12-09 DIAGNOSIS — F0390 Unspecified dementia without behavioral disturbance: Secondary | ICD-10-CM

## 2014-12-09 DIAGNOSIS — M810 Age-related osteoporosis without current pathological fracture: Secondary | ICD-10-CM

## 2014-12-09 LAB — CBC WITH DIFFERENTIAL/PLATELET
BASOS PCT: 0.5 % (ref 0.0–3.0)
Basophils Absolute: 0 10*3/uL (ref 0.0–0.1)
EOS PCT: 2.3 % (ref 0.0–5.0)
Eosinophils Absolute: 0.1 10*3/uL (ref 0.0–0.7)
HCT: 26.7 % — ABNORMAL LOW (ref 36.0–46.0)
Hemoglobin: 8.6 g/dL — ABNORMAL LOW (ref 12.0–15.0)
LYMPHS PCT: 31.4 % (ref 12.0–46.0)
Lymphs Abs: 1.9 10*3/uL (ref 0.7–4.0)
MCHC: 32.2 g/dL (ref 30.0–36.0)
MCV: 92.5 fl (ref 78.0–100.0)
MONOS PCT: 11 % (ref 3.0–12.0)
Monocytes Absolute: 0.7 10*3/uL (ref 0.1–1.0)
NEUTROS PCT: 54.8 % (ref 43.0–77.0)
Neutro Abs: 3.3 10*3/uL (ref 1.4–7.7)
Platelets: 260 10*3/uL (ref 150.0–400.0)
RBC: 2.88 Mil/uL — AB (ref 3.87–5.11)
RDW: 16.6 % — ABNORMAL HIGH (ref 11.5–15.5)
WBC: 6 10*3/uL (ref 4.0–10.5)

## 2014-12-09 LAB — COMPREHENSIVE METABOLIC PANEL
ALT: 10 U/L (ref 0–35)
AST: 19 U/L (ref 0–37)
Albumin: 3.3 g/dL — ABNORMAL LOW (ref 3.5–5.2)
Alkaline Phosphatase: 59 U/L (ref 39–117)
BUN: 28 mg/dL — AB (ref 6–23)
CALCIUM: 9.2 mg/dL (ref 8.4–10.5)
CHLORIDE: 102 meq/L (ref 96–112)
CO2: 23 meq/L (ref 19–32)
Creatinine, Ser: 1.7 mg/dL — ABNORMAL HIGH (ref 0.4–1.2)
GFR: 31.06 mL/min — AB (ref >60.00)
Glucose, Bld: 101 mg/dL — ABNORMAL HIGH (ref 70–99)
Potassium: 5 mEq/L (ref 3.5–5.1)
Sodium: 132 mEq/L — ABNORMAL LOW (ref 135–145)
Total Bilirubin: 0.5 mg/dL (ref 0.2–1.2)
Total Protein: 7.9 g/dL (ref 6.0–8.3)

## 2014-12-09 LAB — LIPID PANEL
CHOL/HDL RATIO: 5
Cholesterol: 149 mg/dL (ref 0–200)
HDL: 32.1 mg/dL — ABNORMAL LOW (ref >39.00)
LDL Cholesterol: 87 mg/dL (ref 0–99)
NonHDL: 116.9
Triglycerides: 150 mg/dL — ABNORMAL HIGH (ref 0.0–149.0)
VLDL: 30 mg/dL (ref 0.0–40.0)

## 2014-12-09 LAB — TSH: TSH: 0.22 u[IU]/mL — AB (ref 0.35–4.50)

## 2014-12-09 LAB — SEDIMENTATION RATE: Sed Rate: 118 mm/hr — ABNORMAL HIGH (ref 0–22)

## 2014-12-09 NOTE — Assessment & Plan Note (Signed)
Recheck today. 

## 2014-12-09 NOTE — Progress Notes (Signed)
Pre visit review using our clinic review tool, if applicable. No additional management support is needed unless otherwise documented below in the visit note. 

## 2014-12-09 NOTE — Assessment & Plan Note (Signed)
Check TSH today

## 2014-12-09 NOTE — Assessment & Plan Note (Signed)

## 2014-12-09 NOTE — Assessment & Plan Note (Signed)
Orthostatic today - will stop amlodipine.

## 2014-12-09 NOTE — Assessment & Plan Note (Signed)
Reviewed status.

## 2014-12-09 NOTE — Addendum Note (Signed)
Addended by: Ria Bush on: 12/09/2014 09:52 PM   Modules accepted: Miquel Dunn

## 2014-12-09 NOTE — Assessment & Plan Note (Signed)
Not bisphosphonate candidate 2/2 CRI.  May need to start cal/vit D.

## 2014-12-09 NOTE — Assessment & Plan Note (Signed)
Continue lipitor. Check FLP today.

## 2014-12-09 NOTE — Progress Notes (Addendum)
BP 110/56 mmHg  Pulse 68  Temp(Src) 97.9 F (36.6 C) (Oral)  Ht 5' 2" (1.575 m)  Wt 150 lb 12 oz (68.38 kg)  BMI 27.57 kg/m2   CC: Medicare wellness  Subjective:    Patient ID: Becky Adkins, female    DOB: 06/28/1925, 80 y.o.   MRN: [id number]  HPI: Becky Adkins is a 80 y.o. female presenting on 12/09/2014 for Annual Exam   PMR - long-term prednisone. Currently on 20m daily.   Increased dizziness noted today when first out of bed.  Noticing increased trouble with memory. No longer in memory unit, has moved to different higher LOC floor. Some trouble remembering son.   Unable to do hearing and vision screens today. Known chronic R ear hearing loss. Loses glasses.  No falls in last year - unsure 2/2 dementia. regularly uses walker.  Denies depression/anhedonia. Doing well with celexa 320m   Preventative: Colonoscopy - 2006 and normal per records/family. Aged out.  Mammogram - ordered last year but never completed. Would like to age out. Will do breast exam today.  Flu shot yearly.  Tetanus - 2012.  pneuovax - 2012, prevnar today.  zostavax - 2008  Advanced directives: does not have living will. "i've had a good life". Son is HCPOA.  Would like MOST form, has signed DNR.  MOST form filled 12/2012, ok for limited interventions, ok for transfer to ER but try to avoid ICU.  Does not want compressions or intubation.  Would consider short term IV fluid, abx and even feeding tube.   Caffeine: 3 cups/day [place] coach[place], RNTherapist, sportst [place] diet, cardiac diet prescribed  Relevant past medical, surgical, family and social history reviewed and updated as indicated. Interim medical history since our last visit reviewed. Allergies and medications reviewed and updated.  Current Outpatient Prescriptions on File Prior to Visit  Medication Sig  . Acetaminophen 500 MG coapsule  Take 1 capsule (500 mg total) by mouth 3 (three) times daily as needed for fever or pain.  . Marland Kitchenspirin 81 MG EC tablet TAKE 1 TABLET BY MOUTH EACH DAY. ANTI PLATELET AGGREGATION  . atorvastatin (LIPITOR) 20 MG tablet TAKE ONE TABLET BY MOUTH EACH DAY AT HS . (IMPROVES CHOLESTEROL)  . benzonatate (TESSALON PERLES) 100 MG capsule Take 1 capsule (100 mg total) by mouth 2 (two) times daily as needed for cough.  . citalopram (CELEXA) 20 MG tablet TAKE 1 & 1/2 TABLETS BY MOUTH IN THE MORNING. (DEPRESSION)  . isosorbide mononitrate (IMDUR) 30 MG 24 hr tablet TAKE ONE TABLET BY MOUTH TWICE DAILY FOR HEART.  . Marland Kitchenevothyroxine (SYNTHROID, LEVOTHROID) 100 MCG tablet TAKE ONE TABLET BY MOUTH TWICE WEEKLY ON WEDNESDAYS AND SUNDAYS. (THYROID) TAKE ON EMPTY STOMACH.  . Marland Kitchenevothyroxine (SYNTHROID, LEVOTHROID) 50 MCG tablet Take one daily by mouth, but take two pills on Wednesday and Sunday. (Patient taking differently: Take one daily by mouth, except on Wednesday and Sunday.)  . metoprolol tartrate (LOPRESSOR) 25 MG tablet ONE TABLET BY MOUTH EVERY 12 HOURS FOR HIGH BLOOD PRESSURE.  . Multiple Vitamins-Minerals (PRESERVISION AREDS) TABS TAKE ONE TABLET BY MOUTH ONCE DAILY IN THE AM FOR SUPPLEMENT  . nitroGLYCERIN (NITROSTAT) 0.4 MG SL tablet Place 0.4 mg under the tongue every 5 (five) minutes as needed.    . pantoprazole (PROTONIX) 40 MG tablet TAKE 1 TABLET BY MOUTH EACH MORNING. (GERD) **DO NOT CRUSH**  . predniSONE (DELTASONE)  1 MG tablet Take 2 tablets (2 mg total) by mouth daily. For polymyalgia rhuematrica  . ranolazine (RANEXA) 500 MG 12 hr tablet Take 1 tablet (500 mg total) by mouth 2 (two) times daily.  . traZODone (DESYREL) 50 MG tablet TAKE 1/2 TABLET BY MOUTH EACH NIGHT AT BEDTIME. (SLEEP/DEPRESSION)  . vitamin B-12 (CYANOCOBALAMIN) 1000 MCG tablet TAKE ONE TABLET BY MOUTH EACH DAY.   No current facility-administered medications on file prior to visit.    Review of Systems Per HPI unless specifically  indicated above     Objective:    BP 110/56 mmHg  Pulse 68  Temp(Src) 97.9 F (36.6 C) (Oral)  Ht 5' 2" (1.575 m)  Wt 150 lb 12 oz (68.38 kg)  BMI 27.57 kg/m2  Wt Readings from Last 3 Encounters:  12/09/14 150 lb 12 oz (68.38 kg)  11/13/13 159 lb 12 oz (72.462 kg)  10/11/13 161 lb 12 oz (73.369 kg)    Physical Exam  Constitutional: She is oriented to person, place, and time. She appears well-developed and well-nourished. No distress.  HENT:  Head: Normocephalic and atraumatic.  Right Ear: Hearing, tympanic membrane, external ear and ear canal normal.  Left Ear: Hearing, tympanic membrane, external ear and ear canal normal.  Nose: Nose normal.  Mouth/Throat: Uvula is midline and mucous membranes are normal. No oropharyngeal exudate, posterior oropharyngeal edema or posterior oropharyngeal erythema.  Dry MM  Eyes: Conjunctivae and EOM are normal. Pupils are equal, round, and reactive to light. No scleral icterus.  Neck: Normal range of motion. Neck supple. Carotid bruit is not present. No thyromegaly present.  Cardiovascular: Normal rate, regular rhythm, normal heart sounds and intact distal pulses.   No murmur heard. Pulses:      Radial pulses are 2+ on the right side, and 2+ on the left side.  Pulmonary/Chest: Effort normal and breath sounds normal. No respiratory distress. She has no wheezes. She has no rales.  Abdominal: Soft. Bowel sounds are normal. She exhibits no distension and no mass. There is no tenderness. There is no rebound and no guarding.  Musculoskeletal: Normal range of motion. She exhibits no edema.  Lymphadenopathy:    She has no cervical adenopathy.  Neurological: She is alert and oriented to person, place, and time.  CN grossly intact, station and gait intact Memory: known dementia  Skin: Skin is warm and dry. No rash noted.  Psychiatric: She has a normal mood and affect. Her behavior is normal. Judgment and thought content normal.  Nursing note and  vitals reviewed.  Results for orders placed or performed in visit on 11/13/13  Iron Binding Cap (TIBC)  Result Value Ref Range   TIBC 299 250 - 450 ug/dL   UIBC 257 150 - 375 ug/dL   Iron 42 35 - 155 ug/dL   Iron Saturation 14 (L) 15 - 55 %  Ferritin  Result Value Ref Range   Ferritin 35 15 - 150 ng/mL  CBC  Result Value Ref Range   WBC 5.4 3.4 - 10.8 x10E3/uL   RBC 3.03 (L) 3.77 - 5.28 x10E6/uL   Hemoglobin 9.1 (L) 11.1 - 15.9 g/dL   HCT 28.3 (L) 34.0 - 46.6 %   MCV 93 79 - 97 fL   MCH 30.0 26.6 - 33.0 pg   MCHC 32.2 31.5 - 35.7 g/dL   RDW 15.2 12.3 - 15.4 %   Platelets 300 150 - 379 x10E3/uL      Assessment & Plan:   Problem List  Items Addressed This Visit    Vitamin B12 deficiency    Continue b12 oral.    Polymyalgia rheumatica    Check ESR, consider continued decreased prednisone dose.    Relevant Orders      Sedimentation rate   Osteoporosis    Not bisphosphonate candidate 2/2 CRI.  May need to start cal/vit D.    Medicare annual wellness visit, subsequent - Primary    I have personally reviewed the Medicare Annual Wellness questionnaire and have noted 1. The patient's medical and social history 2. Their use of alcohol, tobacco or illicit drugs 3. Their current medications and supplements 4. The patient's functional ability including ADL's, fall risks, home safety risks and hearing or visual impairment. 5. Diet and physical activity 6. Evidence for depression or mood disorders The patients weight, height, BMI have been recorded in the chart.  Hearing and vision has been addressed. I have made referrals, counseling and provided education to the patient based review of the above and I have provided the pt with a written personalized care plan for preventive services. Provider list updated - see scanned questionairre. Reviewed preventative protocols and updated unless pt declined.    Hypothyroid    Check TSH today.    Relevant Orders      TSH   Hypertension     Orthostatic today - will stop amlodipine.    Relevant Orders      Comprehensive metabolic panel   Hyperlipidemia    Continue lipitor. Check FLP today.     Relevant Orders      Lipid panel      Comprehensive metabolic panel   DNR (do not resuscitate)    Reviewed status.    Depression    Son declines change in med regimen for now as pt doing very well. Continue celexa/trazodone.    Dementia    Chronic, stable. Did not tolerate aricept.    Chronic kidney disease (CKD), stage III (moderate)    Chronic, stable. Recheck today. ARB held 09/2013    Relevant Orders      CBC with Differential   Anemia    Recheck today.    Relevant Orders      CBC with Differential   Advanced care planning/counseling discussion    Advanced directives: does not have living will. "i've had a good life". Son is HCPOA. Would like MOST form, has signed DNR.  MOST form filled 12/2012, ok for limited interventions, ok for transfer to ER but try to avoid ICU.  Does not want compressions or intubation.  Would consider short term IV fluid, abx and even feeding tube.      Other Visit Diagnoses    Need for prophylactic vaccination against Streptococcus pneumoniae (pneumococcus)        Relevant Orders       Pneumococcal conjugate vaccine 13-valent (Completed)        Follow up plan: Return in about 1 year (around 12/10/2015), or as needed, for medicare wellness.

## 2014-12-09 NOTE — Assessment & Plan Note (Signed)
Check ESR, consider continued decreased prednisone dose.

## 2014-12-09 NOTE — Assessment & Plan Note (Signed)
Son declines change in med regimen for now as pt doing very well. Continue celexa/trazodone.

## 2014-12-09 NOTE — Assessment & Plan Note (Signed)
Continue b12 oral 

## 2014-12-09 NOTE — Assessment & Plan Note (Signed)
Chronic, stable. Did not tolerate aricept.

## 2014-12-09 NOTE — Patient Instructions (Addendum)
prevnar today labwork today. Blood work did drop some after standing up - this may be accounting for dizziness. Let's stop amlodipine. Continue other medications. If ESR level stable, we will continue to drop prednisone to 82m daily. We will call you with labwork results. Good to see you today, call [place]ore[place] questions. Return as needed or in 1 year for next wellness visit.

## 2014-12-09 NOTE — Assessment & Plan Note (Signed)
Chronic, stable. Recheck today. ARB held 09/2013

## 2014-12-09 NOTE — Assessment & Plan Note (Signed)
Advanced directives: does not have living will. "i've had a good life". Son is HCPOA. Would like MOST form, has signed DNR.  MOST form filled 12/2012, ok for limited interventions, ok for transfer to ER but try to avoid ICU.  Does not want compressions or intubation.  Would consider short term IV fluid, abx and even feeding tube.

## 2014-12-10 ENCOUNTER — Encounter: Payer: Self-pay | Admitting: Family Medicine

## 2014-12-10 ENCOUNTER — Telehealth: Payer: Self-pay | Admitting: Family Medicine

## 2014-12-10 ENCOUNTER — Other Ambulatory Visit: Payer: Self-pay | Admitting: Family Medicine

## 2014-12-10 MED ORDER — LEVOTHYROXINE SODIUM 100 MCG PO TABS: 100.0000 ug | ORAL_TABLET | Freq: Every day | ORAL | Status: DC

## 2014-12-10 MED ORDER — PREDNISONE 1 MG PO TABS: 1.0000 mg | ORAL_TABLET | Freq: Every day | ORAL | Status: DC

## 2014-12-10 MED ORDER — LEVOTHYROXINE SODIUM 50 MCG PO TABS: ORAL_TABLET | ORAL | Status: DC

## 2014-12-10 NOTE — Telephone Encounter (Signed)
emmi emailed °

## 2014-12-18 ENCOUNTER — Telehealth: Payer: Self-pay

## 2014-12-18 NOTE — Telephone Encounter (Signed)
Spoke to [place] and was advised that [facility] gave patient Robitussin this morning which is a standing ordering as needed. Melissa stated that patient has not complained of any symptoms since she has been there and is sitting and eating dinner now. Melissa stated that they will call back if patient complains of any more symptoms.

## 2014-12-18 NOTE — Telephone Encounter (Signed)
Noted, thanks!

## 2014-12-18 NOTE — Telephone Encounter (Signed)
Becky with [facility] left v/m pt has had S/T for few days and [facility] request med called in for pt. I called back and Jacqlyn Larsen was gone for the day and spoke with Lenna Sciara, med tech; Lenna Sciara is not aware of Becky calling and she will ck with pt about other symptoms and vitals and will cb if needed. Sending to Dr Damita Dunnings for his info.

## 2014-12-18 NOTE — Telephone Encounter (Signed)
I need whatever info you can get.  I'll await the return call.  I didn't do anything else for now. Thanks.

## 2014-12-24 ENCOUNTER — Telehealth: Payer: Self-pay | Admitting: Family Medicine

## 2014-12-24 NOTE — Telephone Encounter (Signed)
plz schedule appt with next available provider - 1wk of ST now with [facility].

## 2014-12-24 NOTE — Telephone Encounter (Signed)
Attempted to call Vaughan Basta. No answer, no machine. Will try again tomorrow.

## 2014-12-24 NOTE — Telephone Encounter (Signed)
Linda from [facility] called today to notify that pt is again complaining of sore throat and is also complaining of head hurting. The staff has been giving her Cough Syrup but this doesn't seem to be helping. Requesting a call back. [phone]

## 2014-12-25 ENCOUNTER — Encounter: Payer: Self-pay | Admitting: Family Medicine

## 2014-12-25 ENCOUNTER — Ambulatory Visit (INDEPENDENT_AMBULATORY_CARE_PROVIDER_SITE_OTHER): Payer: Medicare Other | Admitting: Family Medicine

## 2014-12-25 VITALS — BP 122/78 | HR 68 | Temp 97.8°F | Wt 152.2 lb

## 2014-12-25 DIAGNOSIS — R0982 Postnasal drip: Secondary | ICD-10-CM

## 2014-12-25 MED ORDER — LORATADINE 5 MG PO CHEW: CHEWABLE_TABLET | ORAL | Status: AC

## 2014-12-25 NOTE — Assessment & Plan Note (Signed)
Likely causing the ST, happens this time of year.  Benign exam o/w.  D/w pt and son.   Add on 5mg  claritin, inc to 10mg  if needed.  Fu prn.   No sign of infection process ongoing.

## 2014-12-25 NOTE — Progress Notes (Signed)
Pre visit review using our clinic review tool, if applicable. No additional management support is needed unless otherwise documented below in the visit note.  Frequently complained of ST this time of year, over the years.  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  No fevers >100.4.  She feels "good" now.   Not SOB, no complaints.  No cough now, prev with some recently, actually it was throat clearing- clarified.  No ear pain.  No facial pain.   Swallowing well.  Eating well.  No rash.  Some rhinorrhea, episodic, mild.   Prev phone notes re: ST noted.   Her mentation improved as her BP increased with the last med change her PCP.   Meds, vitals, and allergies reviewed.   ROS: See HPI.  Otherwise, noncontributory.  GEN: nad, alert and oriented HEENT: mucous membranes moist, tm w/o erythema, nasal exam w/o erythema, scant clear discharge noted,  OP with no erythema or cobblestoning, sinuses not ttp NECK: supple w/o LA CV: rrr.   PULM: ctab, no inc wob

## 2014-12-25 NOTE — Patient Instructions (Signed)
Add on claritin and take care.  Glad to see you.

## 2014-12-25 NOTE — Telephone Encounter (Signed)
Spoke with Vaughan Basta. She isn't sure if actual ST or if dementia has just worsened. Spoke with patient's daughter in law for transportation arrangements. She said family has noticed a rapid decline in dementia recently. Only complaining of ST off and on but says it isn't uncommon for her this time of year due to sinus congestion and PND. Appt scheduled today with Dr. Damita Dunnings. None available with Dr. Darnell Level.

## 2014-12-27 DIAGNOSIS — D472 Monoclonal gammopathy: Secondary | ICD-10-CM

## 2014-12-27 HISTORY — DX: Monoclonal gammopathy: D47.2

## 2015-01-02 ENCOUNTER — Telehealth: Payer: Self-pay | Admitting: Family Medicine

## 2015-01-02 NOTE — Telephone Encounter (Signed)
Order faxed advising that amlodipine was stopped at 12/09/14 visit due to dizziness upon standing.

## 2015-01-02 NOTE — Telephone Encounter (Signed)
Someone put auto stop on her Norvast 2.5 mg, and Vaughan Basta wants to make sure that she should not be taking this medication. Please give Vaughan Basta a call [phone] regarding this. She has already called the pharmacy and they want her to get clarification. It is printed on the [facility] but she has not been taking it. If possible, please fax an order to dc this or to continue taking it. [phone].

## 2015-01-03 ENCOUNTER — Other Ambulatory Visit (INDEPENDENT_AMBULATORY_CARE_PROVIDER_SITE_OTHER): Payer: Self-pay

## 2015-01-03 DIAGNOSIS — R509 Fever, unspecified: Secondary | ICD-10-CM

## 2015-01-03 DIAGNOSIS — N183 Chronic kidney disease, stage 3 unspecified: Secondary | ICD-10-CM

## 2015-01-04 LAB — URINALYSIS W MICROSCOPIC + REFLEX CULTURE
BILIRUBIN URINE: NEGATIVE
CRYSTALS: NONE SEEN
Casts: NONE SEEN
Glucose, UA: NEGATIVE mg/dL
Ketones, ur: NEGATIVE mg/dL
Nitrite: NEGATIVE
PH: 8 (ref 5.0–8.0)
Protein, ur: 30 mg/dL — AB
Squamous Epithelial / LPF: NONE SEEN
UROBILINOGEN UA: 0.2 mg/dL (ref 0.0–1.0)

## 2015-01-05 ENCOUNTER — Emergency Department: Payer: Self-pay | Admitting: Emergency Medicine

## 2015-01-05 LAB — CBC
HCT: 27.8 % — ABNORMAL LOW (ref 35.0–47.0)
HGB: 9.2 g/dL — ABNORMAL LOW (ref 12.0–16.0)
MCH: 31.1 pg (ref 26.0–34.0)
MCHC: 32.9 g/dL (ref 32.0–36.0)
MCV: 95 fL (ref 80–100)
Platelet: 277 10*3/uL (ref 150–440)
RBC: 2.95 10*6/uL — ABNORMAL LOW (ref 3.80–5.20)
RDW: 16.3 % — AB (ref 11.5–14.5)
WBC: 6.9 10*3/uL (ref 3.6–11.0)

## 2015-01-05 LAB — COMPREHENSIVE METABOLIC PANEL
ANION GAP: 10 (ref 7–16)
Albumin: 3 g/dL — ABNORMAL LOW (ref 3.4–5.0)
Alkaline Phosphatase: 89 U/L
BUN: 24 mg/dL — ABNORMAL HIGH (ref 7–18)
Bilirubin,Total: 0.2 mg/dL (ref 0.2–1.0)
CALCIUM: 8.6 mg/dL (ref 8.5–10.1)
CHLORIDE: 101 mmol/L (ref 98–107)
CO2: 24 mmol/L (ref 21–32)
Creatinine: 1.53 mg/dL — ABNORMAL HIGH (ref 0.60–1.30)
EGFR (African American): 41 — ABNORMAL LOW
EGFR (Non-African Amer.): 34 — ABNORMAL LOW
Glucose: 86 mg/dL (ref 65–99)
OSMOLALITY: 273 (ref 275–301)
POTASSIUM: 4.3 mmol/L (ref 3.5–5.1)
SGOT(AST): 27 U/L (ref 15–37)
SGPT (ALT): 15 U/L
SODIUM: 135 mmol/L — AB (ref 136–145)
Total Protein: 8.9 g/dL — ABNORMAL HIGH (ref 6.4–8.2)

## 2015-01-05 LAB — LIPASE, BLOOD: Lipase: 411 U/L — ABNORMAL HIGH (ref 73–393)

## 2015-01-05 LAB — TROPONIN I: Troponin-I: <0.02 ng/mL

## 2015-01-07 ENCOUNTER — Other Ambulatory Visit: Payer: Self-pay | Admitting: Family Medicine

## 2015-01-07 ENCOUNTER — Other Ambulatory Visit: Payer: Self-pay | Admitting: *Deleted

## 2015-01-07 ENCOUNTER — Encounter: Payer: Self-pay | Admitting: *Deleted

## 2015-01-07 LAB — URINE CULTURE: Colony Count: >=100000

## 2015-01-07 MED ORDER — SULFAMETHOXAZOLE-TRIMETHOPRIM 800-160 MG PO TABS: ORAL_TABLET | ORAL | Status: DC

## 2015-01-09 ENCOUNTER — Emergency Department: Payer: Self-pay | Admitting: Emergency Medicine

## 2015-01-10 ENCOUNTER — Encounter: Payer: Self-pay | Admitting: Family Medicine

## 2015-01-21 ENCOUNTER — Telehealth: Payer: Self-pay | Admitting: Family Medicine

## 2015-01-21 MED ORDER — BENZONATATE 100 MG PO CAPS
100.0000 mg | ORAL_CAPSULE | Freq: Two times a day (BID) | ORAL | Status: DC | PRN
Start: 2015-01-21 — End: 2015-02-19

## 2015-01-21 NOTE — Telephone Encounter (Signed)
yevett call from [facility] needing to et an order for benbonacataz 100 mg oral cap 3 x daily  For ms Wilborn  She went [facility] for acute broncitias

## 2015-01-21 NOTE — Telephone Encounter (Signed)
Called [facility] and spoke with med tech-Ty, who advised patient needed refill on tessalon perles sent to [facility]. Refill sent and order faxed to [facility].

## 2015-02-18 ENCOUNTER — Telehealth: Payer: Self-pay

## 2015-02-18 NOTE — Telephone Encounter (Signed)
Pt son called, states pt care home called [name]) stating pt had some angina and they gave her Nitro around 8:30-9:00. She also complained of back pain, pt has appt tomorrow 2/24 at 2:00

## 2015-02-19 ENCOUNTER — Encounter: Payer: Self-pay | Admitting: Cardiovascular Disease

## 2015-02-19 ENCOUNTER — Ambulatory Visit (INDEPENDENT_AMBULATORY_CARE_PROVIDER_SITE_OTHER): Payer: Medicare Other | Admitting: Cardiovascular Disease

## 2015-02-19 VITALS — BP 80/44 | HR 71 | Ht 61.0 in | Wt 145.0 lb

## 2015-02-19 DIAGNOSIS — R079 Chest pain, unspecified: Secondary | ICD-10-CM

## 2015-02-19 DIAGNOSIS — I25110 Atherosclerotic heart disease of native coronary artery with unstable angina pectoris: Secondary | ICD-10-CM

## 2015-02-19 DIAGNOSIS — I951 Orthostatic hypotension: Secondary | ICD-10-CM | POA: Insufficient documentation

## 2015-02-19 DIAGNOSIS — R319 Hematuria, unspecified: Secondary | ICD-10-CM

## 2015-02-19 DIAGNOSIS — N390 Urinary tract infection, site not specified: Secondary | ICD-10-CM

## 2015-02-19 DIAGNOSIS — F0390 Unspecified dementia without behavioral disturbance: Secondary | ICD-10-CM

## 2015-02-19 DIAGNOSIS — E785 Hyperlipidemia, unspecified: Secondary | ICD-10-CM

## 2015-02-19 NOTE — Assessment & Plan Note (Signed)
Recent anginal symptoms, requiring nitroglycerin twice in the past week. Encourage fluids for increased blood pressure. If symptoms persist, would continue nitroglycerin as needed. Suggested she take this in the supine position. Could increase Ranexa to 1000 g twice a day

## 2015-02-19 NOTE — Assessment & Plan Note (Signed)
Significant drop in her blood pressure with standing. We'll check a urinalysis and culture in case her UTI has not resolved. Encouraged oral fluids at the nursing home as son reports she does not drink much. Would not cut back on her isosorbide at this time given her worsening angina.

## 2015-02-19 NOTE — Assessment & Plan Note (Signed)
Stable, lives at a nursing home. Pleasant today

## 2015-02-19 NOTE — Patient Instructions (Signed)
We will order a UA and urine culture given orthostasis  Please push fluids,  encourage fluids between meals, There is a significant drop in blood pressure with standing suggestive of mild dehydration.  For chest pain, back pain concerning for angina, Please lay in bed, Take a NTG SL,  Check the blood pressure Ok to repeat NTG SL if pain persists, Hold NTG for systolic pressure less than 95 Ok to give up to NTG x 3 (every 5 minutes if blood pressure tolerates).  If pain persists after NTG x 3, call the EMTS  Please call us if you have new issues that need to be addressed before your next appt.  Your physician wants you to follow-up in: 3 months.

## 2015-02-19 NOTE — Progress Notes (Signed)
Patient ID: Becky Adkins, female    DOB: [birth year], 81 y.o.   MRN: [id number]  HPI Comments: Dorri Ozturk is a very pleasant 81 y.o. female  with a history of 3V CAD treated medically, dementia, HTN, HLD, PMR (on chronic prednisone), hypothyroidism, h/o GIB/PUD, GERD, hiatal hernia, h/o normocytic anemia and h/o TIA who presents today for routine followup of her coronary artery disease. Recent history of angina.   cardiac catheterization in 11/2010 at [facility] revealing 80% left main, 70% mid LAD, 90% mid LCx, 60% prox RCA, 50% dis RCA, 90% ostial PLB.  Echo 11/2010- EF > 55%, mild LVH, mild LA dilatation, mildly elevated RVSP (30-40 mmHg), mild TR.    transferred to [facility] to consider CABG vs LM stent.  Concern of sternotomy healing with chronic prednisone use. GIB in 12/2009,  recurrent GIB with PCI requiring DAPT.  the decision was made to treat medically.   In follow-up today, son presents with her. He is concerned about increasing frequency of chest pain concerning for angina. She is taking nitroglycerin 2 times in the past week. Orthostatics done in the office shows a drop in her blood pressure from 107 supine, 98 systolic sitting, 88 standing with recovery of up to 106 after several minutes. Son has also reported having seen low blood pressures at the nursing home. Recently treated for urinary tract infection. She denies any dysuria. Son reports there is worsening incontinence per the staff at the nursing home. She walks with a walker. Needs assistance. Has back pain which is her typical anginal symptom  EKG on today's visit shows normal sinus rhythm with rate 71 bpm, nonspecific ST abnormality  Other past medical history She is a resident of [facility].  TSH normal at 1.01 in 07/2013.  Hgb 9.2/Hct 27.5, MCV 91.9 in 07/2013      Allergies  Allergen Reactions  . Aricept [Donepezil Hydrochloride] Other (See Comments)    Agitated, aggressive  . Codeine     Nausea    . Penicillins     Rash     Outpatient Encounter Prescriptions as of 02/19/2015  Medication Sig  . Acetaminophen 500 MG coapsule Take 1 capsule (500 mg total) by mouth 3 (three) times daily as needed for fever or pain.  Marland Kitchen alum & mag hydroxide-simeth (MAALOX/MYLANTA) 200-200-20 MG/5ML suspension Take 5 mLs by mouth every 6 (six) hours as needed for indigestion or heartburn.  Marland Kitchen aspirin 81 MG EC tablet TAKE 1 TABLET BY MOUTH EACH DAY. ANTI PLATELET AGGREGATION  . atorvastatin (LIPITOR) 20 MG tablet TAKE ONE TABLET BY MOUTH EACH DAY AT HS . (IMPROVES CHOLESTEROL)  . benzonatate (TESSALON) 100 MG capsule Take 100 mg by mouth 3 (three) times daily as needed for cough.  . citalopram (CELEXA) 20 MG tablet TAKE 1 & 1/2 TABLETS BY MOUTH IN THE MORNING. (DEPRESSION)  . isosorbide mononitrate (IMDUR) 30 MG 24 hr tablet TAKE ONE TABLET BY MOUTH TWICE DAILY FOR HEART.  Marland Kitchen levothyroxine (SYNTHROID, LEVOTHROID) 100 MCG tablet Take 1 tablet (100 mcg total) by mouth daily before breakfast. On Sundays only  . levothyroxine (SYNTHROID, LEVOTHROID) 50 MCG tablet Take one daily, but not on Sundays  . loratadine (CLARITIN) 5 MG chewable tablet 5 mg a day for 1 week, increase to 10mg  a day if needed thereafter for runny nose.  . metoprolol tartrate (LOPRESSOR) 25 MG tablet ONE TABLET BY MOUTH EVERY 12 HOURS FOR HIGH BLOOD PRESSURE.  . Multiple Vitamins-Minerals (PRESERVISION AREDS) TABS TAKE ONE TABLET  BY MOUTH ONCE DAILY IN THE AM FOR SUPPLEMENT  . nitroGLYCERIN (NITROSTAT) 0.4 MG SL tablet Place 0.4 mg under the tongue every 5 (five) minutes as needed.    . pantoprazole (PROTONIX) 40 MG tablet TAKE 1 TABLET BY MOUTH EACH MORNING. (GERD) **DO NOT CRUSH**  . predniSONE (DELTASONE) 1 MG tablet Take 1 tablet (1 mg total) by mouth daily. For polymyalgia rhuematrica. Take for 3 months then stop.  . ranitidine (ZANTAC) 150 MG capsule Take 150 mg by mouth 2 (two) times daily.  . ranolazine (RANEXA) 500 MG 12 hr tablet Take  1 tablet (500 mg total) by mouth 2 (two) times daily.  . traZODone (DESYREL) 50 MG tablet TAKE 1/2 TABLET BY MOUTH EACH NIGHT AT BEDTIME. (SLEEP/DEPRESSION)  . vitamin B-12 (CYANOCOBALAMIN) 1000 MCG tablet TAKE ONE TABLET BY MOUTH EACH DAY.  . [DISCONTINUED] benzonatate (TESSALON PERLES) 100 MG capsule Take 1 capsule (100 mg total) by mouth 2 (two) times daily as needed for cough. (Patient not taking: Reported on 02/19/2015)  . [DISCONTINUED] sulfamethoxazole-trimethoprim (BACTRIM DS,SEPTRA DS) 800-160 MG per tablet Take one tablet twice daily for 2 days then one tablet daily for 5 days (Patient not taking: Reported on 02/19/2015)    Past Medical History  Diagnosis Date  . Hypertension   . Hyperlipidemia   . Polymyalgia rheumatica     on prednisone, has seen neuroophthalmologist, had normal temporal artery biopsy at dx  . History of GI bleed 12/2008    w/ submucosal gastric ulcers  . Dementia     mild, significant short term memory loss, eval by memory clinic and didn't think needed further meds  . Hiatal hernia   . Personal history of TIA (transient ischemic attack) 2009    neurology w/u with normal CT head, echo, carotid US  . Anemia     s/p hematology workup, h/o B12 shots  . Macular degeneration     and cataracts  . Deafness     right ear (mumps)  . GERD (gastroesophageal reflux disease)     severe on EGD with HH, H pylori neg  . Hypothyroid     s/p thyroidectomy  . Osteoporosis     was on boniva  . 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 Migraines     none recently  . Arthritis   . Depression   . Urinary incontinence   . CAD (coronary artery disease)     cath 12/11 at [facility]: severe 3-v CAD including LM 80% LAD 70% LCX 90% RCA 50-60%, decided medical treatment  . DNR (do not resuscitate) 12/2012    MOST form filled 12/2012, ok for limited interventions, ok for transfer to ER but try to avoid ICU    Past Surgical History  Procedure  Laterality Date  . Thyroidectomy      for benign tumor  . Appendectomy    . Dilation and curettage of uterus    . Hospitalization  2007    global amnesia  . Hospitalization  12/2008    bleeding ulcer, asp PNA  . Hospitalization  2011    CP, r/o x 2 (neg cardiolite and ETT)  . Esophagogastroduodenoscopy  07/2009    LA grade C reflux esophagitis, HH, single gastric nodule (Teitelman)  . Cholecystectomy    . Colonoscopy  2006    normal per previous records    Social History  reports that she has never smoked. She has never used smokeless tobacco. She  reports that she does not drink alcohol or use illicit drugs.  Family History family history includes Alcohol abuse in her father; Cancer in her mother; Coronary artery disease in her father; Diabetes in her paternal aunt.   Review of Systems  HENT: Negative.   Respiratory: Negative.   Cardiovascular: Negative.   Gastrointestinal: Negative.   Musculoskeletal: Positive for gait problem.  Skin: Negative.   Neurological: Positive for weakness.  Hematological: Negative.   Psychiatric/Behavioral: Negative.   All other systems reviewed and are negative.   BP 80/44 mmHg  Pulse 71  Ht 5\' 1"  (1.549 m)  Wt 145 lb (65.772 kg)  BMI 27.41 kg/m2  Physical Exam  Constitutional: She is oriented to person, place, and time. She appears well-developed and well-nourished.  HENT:  Head: Normocephalic.  Nose: Nose normal.  Mouth/Throat: Oropharynx is clear and moist.  Eyes: Conjunctivae are normal. Pupils are equal, round, and reactive to light.  Neck: Normal range of motion. Neck supple. No JVD present.  Cardiovascular: Normal rate, regular rhythm, S1 normal, S2 normal and intact distal pulses.  Exam reveals no gallop and no friction rub.   Murmur heard.  Systolic murmur is present with a grade of 2/6  Pulmonary/Chest: Effort normal and breath sounds normal. No respiratory distress. She has no wheezes. She has no rales. She exhibits no  tenderness.  Abdominal: Soft. Bowel sounds are normal. She exhibits no distension. There is no tenderness.  Musculoskeletal: Normal range of motion. She exhibits no edema or tenderness.  Lymphadenopathy:    She has no cervical adenopathy.  Neurological: She is alert and oriented to person, place, and time. Coordination normal.  Skin: Skin is warm and dry. No rash noted. No erythema.  Psychiatric: She has a normal mood and affect. Her behavior is normal. Judgment and thought content normal.    Assessment and Plan  Nursing note and vitals reviewed.

## 2015-02-19 NOTE — Assessment & Plan Note (Signed)
Recommend she continue her Lipitor

## 2015-02-20 ENCOUNTER — Other Ambulatory Visit: Payer: Self-pay | Admitting: Cardiovascular Disease

## 2015-02-20 DIAGNOSIS — N390 Urinary tract infection, site not specified: Secondary | ICD-10-CM | POA: Diagnosis not present

## 2015-02-20 DIAGNOSIS — R319 Hematuria, unspecified: Secondary | ICD-10-CM | POA: Diagnosis not present

## 2015-02-21 LAB — MICROSCOPIC EXAMINATION
Casts: NONE SEEN /lpf
WBC, UA: >30 /hpf — AB (ref 0–?)

## 2015-02-21 LAB — URINALYSIS, ROUTINE W REFLEX MICROSCOPIC
BILIRUBIN UA: NEGATIVE
GLUCOSE, UA: NEGATIVE
KETONES UA: NEGATIVE
Nitrite, UA: POSITIVE — AB
RBC, UA: NEGATIVE
Specific Gravity, UA: 1.02 (ref 1.005–1.030)
Urobilinogen, Ur: 0.2 mg/dL (ref 0.2–1.0)
pH, UA: 6 (ref 5.0–7.5)

## 2015-03-18 ENCOUNTER — Telehealth: Payer: Self-pay | Admitting: Family Medicine

## 2015-03-18 DIAGNOSIS — N183 Chronic kidney disease, stage 3 unspecified: Secondary | ICD-10-CM

## 2015-03-18 DIAGNOSIS — E039 Hypothyroidism, unspecified: Secondary | ICD-10-CM

## 2015-03-18 NOTE — Telephone Encounter (Signed)
Received request from [facility] for updated labs - and pt is due for recheck TSH and B12 and vit D.  Would suggest she come in for rpt labs unless we can order labs at [facility] and in that case let me know and I will write order. Also, anemia was worsening as of 11/2014 - possibly from worsening kidney function - and this could explain recent anginal episodes. Would suggest discuss with son possible transfusion vs aranesp injections for kidneys - with referral to renal for this.

## 2015-03-19 ENCOUNTER — Other Ambulatory Visit: Payer: Self-pay | Admitting: Family Medicine

## 2015-03-19 DIAGNOSIS — E039 Hypothyroidism, unspecified: Secondary | ICD-10-CM

## 2015-03-19 DIAGNOSIS — E538 Deficiency of other specified B group vitamins: Secondary | ICD-10-CM

## 2015-03-19 DIAGNOSIS — E559 Vitamin D deficiency, unspecified: Secondary | ICD-10-CM

## 2015-03-19 DIAGNOSIS — E785 Hyperlipidemia, unspecified: Secondary | ICD-10-CM

## 2015-03-19 NOTE — Telephone Encounter (Signed)
Spoke with DIL. She will patient in tomorrow for labs. Please order. She is also going to discuss options with patient's son and let me know what they decide.

## 2015-03-19 NOTE — Telephone Encounter (Signed)
Labs ordered.

## 2015-03-20 ENCOUNTER — Other Ambulatory Visit (INDEPENDENT_AMBULATORY_CARE_PROVIDER_SITE_OTHER): Payer: Medicare Other

## 2015-03-20 DIAGNOSIS — E039 Hypothyroidism, unspecified: Secondary | ICD-10-CM | POA: Diagnosis not present

## 2015-03-20 DIAGNOSIS — E559 Vitamin D deficiency, unspecified: Secondary | ICD-10-CM

## 2015-03-20 DIAGNOSIS — N183 Chronic kidney disease, stage 3 unspecified: Secondary | ICD-10-CM

## 2015-03-20 DIAGNOSIS — E538 Deficiency of other specified B group vitamins: Secondary | ICD-10-CM

## 2015-03-20 LAB — RENAL FUNCTION PANEL
ALBUMIN: 3.4 g/dL — AB (ref 3.5–5.2)
BUN: 21 mg/dL (ref 6–23)
CO2: 26 mEq/L (ref 19–32)
Calcium: 9.4 mg/dL (ref 8.4–10.5)
Chloride: 101 mEq/L (ref 96–112)
Creatinine, Ser: 1.37 mg/dL — ABNORMAL HIGH (ref 0.40–1.20)
GFR: 38.74 mL/min — AB (ref >60.00)
Glucose, Bld: 124 mg/dL — ABNORMAL HIGH (ref 70–99)
PHOSPHORUS: 3.4 mg/dL (ref 2.3–4.6)
Potassium: 4.5 mEq/L (ref 3.5–5.1)
Sodium: 132 mEq/L — ABNORMAL LOW (ref 135–145)

## 2015-03-20 LAB — VITAMIN D 25 HYDROXY (VIT D DEFICIENCY, FRACTURES): VITD: 14.61 ng/mL — AB (ref 30.00–100.00)

## 2015-03-20 LAB — CBC WITH DIFFERENTIAL/PLATELET
Basophils Absolute: 0 10*3/uL (ref 0.0–0.1)
Basophils Relative: 0.6 % (ref 0.0–3.0)
EOS ABS: 0.2 10*3/uL (ref 0.0–0.7)
EOS PCT: 4.8 % (ref 0.0–5.0)
HCT: 27.6 % — ABNORMAL LOW (ref 36.0–46.0)
Hemoglobin: 9.2 g/dL — ABNORMAL LOW (ref 12.0–15.0)
LYMPHS PCT: 29.2 % (ref 12.0–46.0)
Lymphs Abs: 1.3 10*3/uL (ref 0.7–4.0)
MCHC: 33.3 g/dL (ref 30.0–36.0)
MCV: 94 fl (ref 78.0–100.0)
MONO ABS: 0.6 10*3/uL (ref 0.1–1.0)
Monocytes Relative: 12.6 % — ABNORMAL HIGH (ref 3.0–12.0)
NEUTROS PCT: 52.8 % (ref 43.0–77.0)
Neutro Abs: 2.3 10*3/uL (ref 1.4–7.7)
Platelets: 263 10*3/uL (ref 150.0–400.0)
RBC: 2.94 Mil/uL — AB (ref 3.87–5.11)
RDW: 16.5 % — ABNORMAL HIGH (ref 11.5–15.5)
WBC: 4.4 10*3/uL (ref 4.0–10.5)

## 2015-03-20 LAB — VITAMIN B12: Vitamin B-12: 1238 pg/mL — ABNORMAL HIGH (ref 211–911)

## 2015-03-20 LAB — T4, FREE: Free T4: 0.61 ng/dL (ref 0.60–1.60)

## 2015-03-20 LAB — TSH: TSH: 9.86 u[IU]/mL — ABNORMAL HIGH (ref 0.35–4.50)

## 2015-03-24 ENCOUNTER — Other Ambulatory Visit: Payer: Self-pay | Admitting: Family Medicine

## 2015-03-24 ENCOUNTER — Telehealth: Payer: Self-pay

## 2015-03-24 MED ORDER — VITAMIN D (ERGOCALCIFEROL) 1.25 MG (50000 UNIT) PO CAPS: 50000.0000 [IU] | ORAL_CAPSULE | ORAL | Status: DC

## 2015-03-24 MED ORDER — LEVOTHYROXINE SODIUM 100 MCG PO TABS: 100.0000 ug | ORAL_TABLET | Freq: Every day | ORAL | Status: DC

## 2015-03-24 MED ORDER — LEVOTHYROXINE SODIUM 50 MCG PO TABS: ORAL_TABLET | ORAL | Status: DC

## 2015-03-24 MED ORDER — VITAMIN B-12 500 MCG PO TABS: 500.0000 ug | ORAL_TABLET | Freq: Every day | ORAL | Status: DC

## 2015-03-24 NOTE — Telephone Encounter (Signed)
Becky Adkins at [facility] left v/m request cb with clarification of how to take levothyroxine 50 mcg and 100 mcg.Please advise.

## 2015-03-24 NOTE — Telephone Encounter (Signed)
Take levothyroxine 61mcg daily except Sun/Wed. Take levothyroxine 136mcg on Sun/Wed

## 2015-03-24 NOTE — Telephone Encounter (Signed)
Baxter Flattery notified and said she would notify Joycelyn Schmid

## 2015-03-25 ENCOUNTER — Telehealth: Payer: Self-pay | Admitting: Family Medicine

## 2015-03-25 NOTE — Telephone Encounter (Signed)
Pts daughter in law Diane called in to speak with Maudie Mercury, has some questions regarding labs and referral. She is requesting a call back.

## 2015-03-25 NOTE — Telephone Encounter (Signed)
Spoke with DIL. See result note.

## 2015-03-26 ENCOUNTER — Other Ambulatory Visit: Payer: Self-pay | Admitting: Family Medicine

## 2015-05-20 ENCOUNTER — Ambulatory Visit (INDEPENDENT_AMBULATORY_CARE_PROVIDER_SITE_OTHER): Payer: Medicare Other | Admitting: Cardiovascular Disease

## 2015-05-20 ENCOUNTER — Encounter: Payer: Self-pay | Admitting: Cardiovascular Disease

## 2015-05-20 VITALS — BP 152/83 | HR 58 | Ht 61.0 in | Wt 133.5 lb

## 2015-05-20 DIAGNOSIS — F0390 Unspecified dementia without behavioral disturbance: Secondary | ICD-10-CM

## 2015-05-20 DIAGNOSIS — I10 Essential (primary) hypertension: Secondary | ICD-10-CM

## 2015-05-20 DIAGNOSIS — D509 Iron deficiency anemia, unspecified: Secondary | ICD-10-CM

## 2015-05-20 DIAGNOSIS — E785 Hyperlipidemia, unspecified: Secondary | ICD-10-CM

## 2015-05-20 DIAGNOSIS — I25110 Atherosclerotic heart disease of native coronary artery with unstable angina pectoris: Secondary | ICD-10-CM

## 2015-05-20 DIAGNOSIS — I208 Other forms of angina pectoris: Secondary | ICD-10-CM

## 2015-05-20 DIAGNOSIS — I209 Angina pectoris, unspecified: Secondary | ICD-10-CM

## 2015-05-20 NOTE — Assessment & Plan Note (Signed)
Currently with no symptoms of angina per the patient or the son. No medication changes made

## 2015-05-20 NOTE — Assessment & Plan Note (Signed)
We call the nursing home but they did not have any blood pressure measurements available. We have recommended to family that they monitor blood pressure. Borderline elevated today No medication changes made

## 2015-05-20 NOTE — Assessment & Plan Note (Signed)
We have ordered a iron panel Hematocrit 27. This can contribute to angina.

## 2015-05-20 NOTE — Patient Instructions (Signed)
You are doing well. No medication changes were made.  We will check irons panel today (ferritin, iron, sat level, TIBC)  Please monitor blood pressure, periodically Goal systolic <553 if possible  Please call us if you have new issues that need to be addressed before your next appt.  Your physician wants you to follow-up in: 6 months.  You will receive a reminder letter in the mail two months in advance. If you don't receive a letter, please call our office to schedule the follow-up appointment.

## 2015-05-20 NOTE — Progress Notes (Signed)
Patient ID: Becky Adkins, female    DOB: 07/18/1925, 81 y.o.   MRN: [id number]  HPI Comments: Becky Adkins is a very pleasant 81 y.o. female  with a history of 3V CAD treated medically, dementia, HTN, HLD, PMR (on chronic prednisone), hypothyroidism, h/o GIB/PUD, GERD, hiatal hernia, h/o normocytic anemia and h/o TIA who presents today for routine followup of her coronary artery disease. Recent history of angina. She is a resident of [facility]  In follow-up today, she presents with her son. She denies any complaints. Son reports no significant anginal symptoms. On her prior clinic visit, blood pressure was low and she had recurrent urinary tract infection.This was treated with antibodies with improvement of her symptoms. She relates with a walker with no symptoms. No back pain concerning for angina   Review of her lab work shows chronic anemia, hematocrit 27 . This was discussed with her son . No recent iron panel   Other past medical history  cardiac catheterization in 11/2010 at [facility] revealing 80% left main, 70% mid LAD, 90% mid LCx, 60% prox RCA, 50% dis RCA, 90% ostial PLB.  Echo 11/2010- EF > 55%, mild LVH, mild LA dilatation, mildly elevated RVSP (30-40 mmHg), mild TR.    transferred to [facility] to consider CABG vs LM stent.  Concern of sternotomy healing with chronic prednisone use. GIB in 12/2009,  recurrent GIB with PCI requiring DAPT.  the decision was made to treat medically.   TSH normal at 1.01 in 07/2013.  Hgb 9.2/Hct 27.5, MCV 91.9 in 07/2013      Allergies  Allergen Reactions  . Aricept [Donepezil Hydrochloride] Other (See Comments)    Agitated, aggressive  . Codeine     Nausea   . Penicillins     Rash     Outpatient Encounter Prescriptions as of 05/20/2015  Medication Sig  . Acetaminophen 500 MG coapsule Take 1 capsule (500 mg total) by mouth 3 (three) times daily as needed for fever or pain.  Marland Kitchen alum & mag hydroxide-simeth (MAALOX/MYLANTA)  200-200-20 MG/5ML suspension Take 5 mLs by mouth every 6 (six) hours as needed for indigestion or heartburn.  Marland Kitchen aspirin 81 MG EC tablet TAKE 1 TABLET BY MOUTH EACH DAY. ANTI PLATELET AGGREGATION  . atorvastatin (LIPITOR) 20 MG tablet TAKE ONE TABLET BY MOUTH EACH DAY AT HS . (IMPROVES CHOLESTEROL)  . benzonatate (TESSALON) 100 MG capsule Take 100 mg by mouth 3 (three) times daily as needed for cough.  . citalopram (CELEXA) 20 MG tablet TAKE 1 & 1/2 TABLETS BY MOUTH IN THE MORNING. (DEPRESSION)  . isosorbide mononitrate (IMDUR) 30 MG 24 hr tablet TAKE ONE TABLET BY MOUTH TWICE DAILY FOR HEART.  Marland Kitchen levothyroxine (SYNTHROID, LEVOTHROID) 100 MCG tablet Take 1 tablet (100 mcg total) by mouth daily before breakfast. On Wednesdays and Sundays only  . levothyroxine (SYNTHROID, LEVOTHROID) 50 MCG tablet Take one daily, but not on Wednesdays or Sundays  . loratadine (CLARITIN) 5 MG chewable tablet 5 mg a day for 1 week, increase to 10mg  a day if needed thereafter for runny nose.  . metoprolol tartrate (LOPRESSOR) 25 MG tablet ONE TABLET BY MOUTH EVERY 12 HOURS FOR HIGH BLOOD PRESSURE.  . Multiple Vitamins-Minerals (PRESERVISION AREDS) TABS TAKE ONE TABLET BY MOUTH ONCE DAILY IN THE AM FOR SUPPLEMENT  . nitroGLYCERIN (NITROSTAT) 0.4 MG SL tablet Place 0.4 mg under the tongue every 5 (five) minutes as needed.    . pantoprazole (PROTONIX) 40 MG tablet TAKE 1 TABLET  BY MOUTH EACH MORNING. (GERD) **DO NOT CRUSH**  . predniSONE (DELTASONE) 1 MG tablet Take 1 tablet (1 mg total) by mouth daily. For polymyalgia rhuematrica. Take for 3 months then stop.  . ranitidine (ZANTAC) 150 MG capsule Take 150 mg by mouth 2 (two) times daily.  . ranolazine (RANEXA) 500 MG 12 hr tablet Take 1 tablet (500 mg total) by mouth 2 (two) times daily.  . traZODone (DESYREL) 50 MG tablet TAKE 1/2 TABLET BY MOUTH EACH NIGHT AT BEDTIME. (SLEEP/DEPRESSION)  . vitamin B-12 (CYANOCOBALAMIN) 500 MCG tablet Take 1 tablet (500 mcg total) by mouth  daily.  . Vitamin D, Ergocalciferol, (DRISDOL) 50000 UNITS CAPS capsule Take 1 capsule (50,000 Units total) by mouth every 7 (seven) days.  . [DISCONTINUED] isosorbide mononitrate (IMDUR) 30 MG 24 hr tablet TAKE ONE TABLET BY MOUTH TWICE DAILY FOR HEART.   No facility-administered encounter medications on file as of 05/20/2015.    Past Medical History  Diagnosis Date  . Hypertension   . Hyperlipidemia   . Polymyalgia rheumatica     on prednisone, has seen neuroophthalmologist, had normal temporal artery biopsy at dx  . History of GI bleed 12/2008    w/ submucosal gastric ulcers  . Dementia     mild, significant short term memory loss, eval by memory clinic and didn't think needed further meds  . Hiatal hernia   . Personal history of TIA (transient ischemic attack) 2009    neurology w/u with normal CT head, echo, carotid US  . Anemia     s/p hematology workup, h/o B12 shots  . Macular degeneration     and cataracts  . Deafness     right ear (mumps)  . GERD (gastroesophageal reflux disease)     severe on EGD with HH, H pylori neg  . Hypothyroid     s/p thyroidectomy  . Osteoporosis     was on boniva  . 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 Migraines     none recently  . Arthritis   . Depression   . Urinary incontinence   . CAD (coronary artery disease)     cath 12/11 at [facility]: severe 3-v CAD including LM 80% LAD 70% LCX 90% RCA 50-60%, decided medical treatment  . DNR (do not resuscitate) 12/2012    MOST form filled 12/2012, ok for limited interventions, ok for transfer to ER but try to avoid ICU    Past Surgical History  Procedure Laterality Date  . Thyroidectomy      for benign tumor  . Appendectomy    . Dilation and curettage of uterus    . Hospitalization  2007    global amnesia  . Hospitalization  12/2008    bleeding ulcer, asp PNA  . Hospitalization  2011    CP, r/o x 2 (neg cardiolite and ETT)  . Esophagogastroduodenoscopy   07/2009    LA grade C reflux esophagitis, HH, single gastric nodule (Teitelman)  . Cholecystectomy    . Colonoscopy  2006    normal per previous records    Social History  reports that she has never smoked. She has never used smokeless tobacco. She reports that she does not drink alcohol or use illicit drugs.  Family History family history includes Alcohol abuse in her father; Cancer in her mother; Coronary artery disease in her father; Diabetes in her paternal aunt.   Review of Systems  Respiratory: Negative.   Cardiovascular: Negative.  Gastrointestinal: Negative.   Musculoskeletal: Positive for gait problem.  Skin: Negative.   Neurological: Negative.   Hematological: Negative.   Psychiatric/Behavioral: Negative.   All other systems reviewed and are negative.   BP 152/83 mmHg  Pulse 58  Ht 5\' 1"  (1.549 m)  Wt 133 lb 8 oz (60.555 kg)  BMI 25.24 kg/m2  Physical Exam  Constitutional: She is oriented to person, place, and time. She appears well-developed and well-nourished.  HENT:  Head: Normocephalic.  Nose: Nose normal.  Mouth/Throat: Oropharynx is clear and moist.  Eyes: Conjunctivae are normal. Pupils are equal, round, and reactive to light.  Neck: Normal range of motion. Neck supple. No JVD present.  Cardiovascular: Normal rate, regular rhythm, S1 normal, S2 normal and intact distal pulses.  Exam reveals no gallop and no friction rub.   Murmur heard.  Systolic murmur is present with a grade of 2/6  Pulmonary/Chest: Effort normal and breath sounds normal. No respiratory distress. She has no wheezes. She has no rales. She exhibits no tenderness.  Abdominal: Soft. Bowel sounds are normal. She exhibits no distension. There is no tenderness.  Musculoskeletal: Normal range of motion. She exhibits no edema or tenderness.  Lymphadenopathy:    She has no cervical adenopathy.  Neurological: She is alert and oriented to person, place, and time. Coordination normal.  Skin:  Skin is warm and dry. No rash noted. No erythema.  Psychiatric: She has a normal mood and affect. Her behavior is normal. Judgment and thought content normal.    Assessment and Plan  Nursing note and vitals reviewed.

## 2015-05-20 NOTE — Assessment & Plan Note (Signed)
Currently with no symptoms of angina. No further workup at this time. Continue current medication regimen. 

## 2015-05-20 NOTE — Assessment & Plan Note (Signed)
Cholesterol is at goal on the current lipid regimen. No changes to the medications were made.  

## 2015-05-20 NOTE — Assessment & Plan Note (Signed)
Son reports she is stable. Lives in assisted living

## 2015-05-21 LAB — IRON AND TIBC
IRON SATURATION: 23 % (ref 15–55)
IRON: 64 ug/dL (ref 27–139)
Total Iron Binding Capacity: 279 ug/dL (ref 250–450)
UIBC: 215 ug/dL (ref 118–369)

## 2015-06-17 ENCOUNTER — Telehealth: Payer: Self-pay

## 2015-06-17 NOTE — Telephone Encounter (Signed)
Becky Adkins with [facility] of [facility] left v/m requesting cb; called back and spoke with Cristie Hem at [facility]; Alex said pt vomited x 1 earlier at lunch time. Pt not having any abd pain, diarrhea, no fever and BP normal and no more vomiting. now pt is in activity room acting normal without complaint. Janeice wanted Dr Darnell Level to be aware of earlier vomiting.

## 2015-06-17 NOTE — Telephone Encounter (Signed)
Noted. If status is stable, just monitor for recurrent nausea/vomiting and update [place] if this happens.

## 2015-06-18 NOTE — Telephone Encounter (Signed)
Spoke with Joycelyn Schmid at [facility] and she is aware as instructed

## 2015-08-04 ENCOUNTER — Telehealth: Payer: Self-pay

## 2015-08-04 NOTE — Telephone Encounter (Signed)
Becky Adkins pts daughter in law left v/m that pt needs refill claritin due to post nasal drip. Spoke with Kenney Houseman at [facility] and pt has refills available and Kenney Houseman will get rx ready. Becky Adkins notified.

## 2015-08-15 ENCOUNTER — Telehealth: Payer: Self-pay | Admitting: Family Medicine

## 2015-08-15 ENCOUNTER — Encounter: Payer: Self-pay | Admitting: Family Medicine

## 2015-08-15 ENCOUNTER — Ambulatory Visit (INDEPENDENT_AMBULATORY_CARE_PROVIDER_SITE_OTHER): Payer: Medicare Other | Admitting: Family Medicine

## 2015-08-15 VITALS — BP 122/56 | HR 65 | Temp 98.3°F | Wt 121.5 lb

## 2015-08-15 DIAGNOSIS — I10 Essential (primary) hypertension: Secondary | ICD-10-CM

## 2015-08-15 DIAGNOSIS — R059 Cough, unspecified: Secondary | ICD-10-CM

## 2015-08-15 DIAGNOSIS — R05 Cough: Secondary | ICD-10-CM | POA: Diagnosis not present

## 2015-08-15 MED ORDER — BENZONATATE 100 MG PO CAPS: 100.0000 mg | ORAL_CAPSULE | Freq: Three times a day (TID) | ORAL | Status: DC | PRN

## 2015-08-15 NOTE — Telephone Encounter (Signed)
Unfortunately, I am not taking any new patients there and plan to stop going there altogether soon.  Sorry

## 2015-08-15 NOTE — Telephone Encounter (Signed)
Rollene Fare doesn't go to [facility]

## 2015-08-15 NOTE — Patient Instructions (Signed)
If your BP is consistently <130/<70, then update [place].  We may need to decrease the metoprolol at that point.   If the cough isn't getting better, then let us know.  Take care.  Glad to see you.

## 2015-08-15 NOTE — Progress Notes (Signed)
Pre visit review using our clinic review tool, if applicable. No additional management support is needed unless otherwise documented below in the visit note.  Recently with a cough, in the last week.  Family has noted cough.  Patient can't give much hx due to dementia.  She hasn't been on claritin until recently.  Some post nasal gtt, improved when restarted on claritin.  Wet cough noted by family, today does seem better than yesterday.  No fevers.  No vomiting.  Still eating some, not a large appetite over the last 6-9 months so that is chronic.  No h/o inc WOB.  No CP.  No swelling in the ankles.  No known sick contacts.    Weight loss noted.  D/w family about possible BP med taper.  She can have her BP checked more often at her facility.  D/w pt's son.   Meds, vitals, and allergies reviewed.   ROS: See HPI.  Otherwise, noncontributory.  nad Pleasant but unable to give recent details due to dementia No inc in WOB Elderly female Mmm Neck supple, no LA rrr Ctab, no wheeze, no rales, no focal dec in BS abd soft Ext w/trace BLE edema.   TM wnl Nasal exam stuffy, OP with minimal posterior irritation.

## 2015-08-15 NOTE — Telephone Encounter (Signed)
Becky Adkins called Becky Adkins is [facility] and wanted to know if dr Silvio Pate or regina could start seeing  her there. She wanted to know what she needed to do for this to happen

## 2015-08-17 DIAGNOSIS — R05 Cough: Secondary | ICD-10-CM | POA: Insufficient documentation

## 2015-08-17 DIAGNOSIS — R059 Cough, unspecified: Secondary | ICD-10-CM | POA: Insufficient documentation

## 2015-08-17 NOTE — Assessment & Plan Note (Signed)
We didn't change BP meds (ie dec BB) but did ask for MWF BP checks and to notify us if consistently <130/<70.  Son agrees.

## 2015-08-17 NOTE — Assessment & Plan Note (Signed)
Some better today, nontoxic, clearly not in distress.  Okay for outpatient f/u.  Could be from post nasal gtt. Continue claritin, add back tessalon.  Chest clear, would be okay to observe for now.  Risk > benefit for use of abx at this point, son agrees.  They'll update [place] as needed.

## 2015-08-18 NOTE — Telephone Encounter (Signed)
Spoke with DIL - advised of below. She values continuity of care and is hesitant to use new practice (like doctors making house calls). She will discuss options with husband.

## 2015-09-08 ENCOUNTER — Emergency Department
Admission: EM | Admit: 2015-09-08 | Discharge: 2015-09-08 | Disposition: A | Discharge status: Home / Self Care | Payer: Medicare Other | Attending: Emergency Medicine | Admitting: Emergency Medicine

## 2015-09-08 ENCOUNTER — Telehealth: Payer: Self-pay

## 2015-09-08 ENCOUNTER — Emergency Department: Payer: Medicare Other

## 2015-09-08 DIAGNOSIS — I129 Hypertensive chronic kidney disease with stage 1 through stage 4 chronic kidney disease, or unspecified chronic kidney disease: Secondary | ICD-10-CM | POA: Insufficient documentation

## 2015-09-08 DIAGNOSIS — Y9389 Activity, other specified: Secondary | ICD-10-CM | POA: Diagnosis not present

## 2015-09-08 DIAGNOSIS — J189 Pneumonia, unspecified organism: Secondary | ICD-10-CM

## 2015-09-08 DIAGNOSIS — Z043 Encounter for examination and observation following other accident: Secondary | ICD-10-CM | POA: Diagnosis present

## 2015-09-08 DIAGNOSIS — Y998 Other external cause status: Secondary | ICD-10-CM | POA: Diagnosis not present

## 2015-09-08 DIAGNOSIS — Z880 Allergy status to penicillin: Secondary | ICD-10-CM | POA: Diagnosis not present

## 2015-09-08 DIAGNOSIS — Z7982 Long term (current) use of aspirin: Secondary | ICD-10-CM | POA: Insufficient documentation

## 2015-09-08 DIAGNOSIS — J159 Unspecified bacterial pneumonia: Secondary | ICD-10-CM | POA: Insufficient documentation

## 2015-09-08 DIAGNOSIS — W06XXXA Fall from bed, initial encounter: Secondary | ICD-10-CM | POA: Diagnosis not present

## 2015-09-08 DIAGNOSIS — Z79899 Other long term (current) drug therapy: Secondary | ICD-10-CM | POA: Insufficient documentation

## 2015-09-08 DIAGNOSIS — T148 Other injury of unspecified body region: Secondary | ICD-10-CM | POA: Diagnosis not present

## 2015-09-08 DIAGNOSIS — M25519 Pain in unspecified shoulder: Secondary | ICD-10-CM | POA: Diagnosis not present

## 2015-09-08 DIAGNOSIS — W19XXXA Unspecified fall, initial encounter: Secondary | ICD-10-CM | POA: Diagnosis not present

## 2015-09-08 DIAGNOSIS — Y92129 Unspecified place in nursing home as the place of occurrence of the external cause: Secondary | ICD-10-CM | POA: Insufficient documentation

## 2015-09-08 DIAGNOSIS — N183 Chronic kidney disease, stage 3 (moderate): Secondary | ICD-10-CM | POA: Diagnosis not present

## 2015-09-08 LAB — CBC WITH DIFFERENTIAL/PLATELET
Basophils Absolute: 0.1 10*3/uL (ref 0–0.1)
Basophils Relative: 1 %
EOS PCT: 4 %
Eosinophils Absolute: 0.3 10*3/uL (ref 0–0.7)
HCT: 27.1 % — ABNORMAL LOW (ref 35.0–47.0)
Hemoglobin: 9.1 g/dL — ABNORMAL LOW (ref 12.0–16.0)
LYMPHS ABS: 1.6 10*3/uL (ref 1.0–3.6)
LYMPHS PCT: 25 %
MCH: 32.4 pg (ref 26.0–34.0)
MCHC: 33.6 g/dL (ref 32.0–36.0)
MCV: 96.5 fL (ref 80.0–100.0)
MONO ABS: 1 10*3/uL — AB (ref 0.2–0.9)
Monocytes Relative: 15 %
Neutro Abs: 3.6 10*3/uL (ref 1.4–6.5)
Neutrophils Relative %: 55 %
PLATELETS: 258 10*3/uL (ref 150–440)
RBC: 2.8 MIL/uL — AB (ref 3.80–5.20)
RDW: 14.4 % (ref 11.5–14.5)
WBC: 6.4 10*3/uL (ref 3.6–11.0)

## 2015-09-08 LAB — BASIC METABOLIC PANEL
Anion gap: 7 (ref 5–15)
BUN: 19 mg/dL (ref 6–20)
CHLORIDE: 99 mmol/L — AB (ref 101–111)
CO2: 26 mmol/L (ref 22–32)
CREATININE: 1.2 mg/dL — AB (ref 0.44–1.00)
Calcium: 8.9 mg/dL (ref 8.9–10.3)
GFR calc Af Amer: 46 mL/min — ABNORMAL LOW (ref >60)
GFR calc non Af Amer: 39 mL/min — ABNORMAL LOW (ref >60)
Glucose, Bld: 106 mg/dL — ABNORMAL HIGH (ref 65–99)
Potassium: 4.2 mmol/L (ref 3.5–5.1)
Sodium: 132 mmol/L — ABNORMAL LOW (ref 135–145)

## 2015-09-08 LAB — URINALYSIS COMPLETE WITH MICROSCOPIC (ARMC ONLY)
BILIRUBIN URINE: NEGATIVE
Bacteria, UA: NONE SEEN
GLUCOSE, UA: NEGATIVE mg/dL
HGB URINE DIPSTICK: NEGATIVE
KETONES UR: NEGATIVE mg/dL
LEUKOCYTES UA: NEGATIVE
NITRITE: NEGATIVE
Protein, ur: NEGATIVE mg/dL
SPECIFIC GRAVITY, URINE: 1.01 (ref 1.005–1.030)
pH: 7 (ref 5.0–8.0)

## 2015-09-08 LAB — TROPONIN I: Troponin I: <0.03 ng/mL (ref <0.031)

## 2015-09-08 MED ORDER — AZITHROMYCIN 250 MG PO TABS: ORAL_TABLET | ORAL | Status: DC

## 2015-09-08 MED ORDER — SODIUM CHLORIDE 0.9 % IV BOLUS (SEPSIS)
500.0000 mL | Freq: Once | INTRAVENOUS | Status: AC
  Administered 2015-09-08: 500 mL via INTRAVENOUS

## 2015-09-08 NOTE — Telephone Encounter (Signed)
Spoke w/ Diane.   She reports that pt was hypotensive on Friday and stumbled a bit. Reports that staff at [facility] found pt on the floor at 3:30 am w/ bloody nose.  At 4:30 am, pt c/o chest pain that woke her up.  Staff gave her nitro (she is not sure how many). She is concerned that pt's BP has been running so low and pt's fall risk. She states that she is less concerned w/ the chest pain and more concerned w/ increased fall risk.  She is requesting an appt for pt to be seen today to adjust pt's meds. Advised her that I am placing her that I will call her in the event of a cancellation and will make Dr. Rockey Situ aware in case we can adjust meds over the phone.  Advised her that if pt is having chest pain, to call 911 or have her taken to the ED (she lives an hour and a half away from pt). She verbalizes understanding and will await a call back.   Received message that Diane called and is having pt taken to ED.

## 2015-09-08 NOTE — ED Notes (Signed)
Patient denies pain and is resting comfortably.  

## 2015-09-08 NOTE — Discharge Instructions (Signed)

## 2015-09-08 NOTE — Telephone Encounter (Signed)
Pt daughter in law called, states pt fell twice this weekend, states this morning she is having chest pains.  Pt c/o of Chest Pain: STAT if CP now or developed within 24 hours  1. Are you having CP right now? Yes bout 7 am  2. Are you experiencing any other symptoms (ex. SOB, nausea, vomiting, sweating)? Dizzy, fell out of bed, daughter in law thinks the cp woke up pt, she fell out of bed and had a bloody nose  3. How long have you been experiencing CP? This morning  4. Is your CP continuous or coming and going? Not sure  5. Have you taken Nitroglycerin? Yes 20 min ago ?

## 2015-09-08 NOTE — ED Provider Notes (Signed)
[facility] Emergency Department Provider Note  ____________________________________________  Time seen: 9:15 AM  I have reviewed the triage vital signs and the nursing notes.   HISTORY  Chief Complaint Fall    HPI Becky Adkins is a 81 y.o. female who sent to the ED for evaluation from her skilled nursing facility due to a fall out of bed. Denies loss of consciousness. Denies pain anywhere. Denies any acute symptoms.     Past Medical History  Diagnosis Date  . Hypertension   . Hyperlipidemia   . Polymyalgia rheumatica     on prednisone, has seen neuroophthalmologist, had normal temporal artery biopsy at dx  . History of GI bleed 12/2008    w/ submucosal gastric ulcers  . Dementia     mild, significant short term memory loss, eval by memory clinic and didn't think needed further meds  . Hiatal hernia   . Personal history of TIA (transient ischemic attack) 2009    neurology w/u with normal CT head, echo, carotid US  . Anemia     s/p hematology workup, h/o B12 shots  . Macular degeneration     and cataracts  . Deafness     right ear (mumps)  . GERD (gastroesophageal reflux disease)     severe on EGD with HH, H pylori neg  . Hypothyroid     s/p thyroidectomy  . Osteoporosis     was on boniva  . 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 Migraines     none recently  . Arthritis   . Depression   . Urinary incontinence   . CAD (coronary artery disease)     cath 12/11 at [facility]: severe 3-v CAD including LM 80% LAD 70% LCX 90% RCA 50-60%, decided medical treatment  . DNR (do not resuscitate) 12/2012    MOST form filled 12/2012, ok for limited interventions, ok for transfer to ER but try to avoid ICU     Patient Active Problem List   Diagnosis Date Noted  . Cough 08/17/2015  . Orthostatic hypotension 02/19/2015  . Post-nasal drip 12/25/2014  . Advanced care planning/counseling discussion 12/09/2014   . Angina, class III 10/11/2013  . Left leg swelling 08/20/2013  . Vitamin B12 deficiency 07/30/2013  . DNR (do not resuscitate)   . Deafness   . Medicare annual wellness visit, subsequent 08/16/2012  . Anemia 08/15/2011  . Chronic kidney disease (CKD), stage III (moderate) 08/13/2011  . Fall 07/22/2011  . Hypertension   . Polymyalgia rheumatica   . Dementia   . Personal history of TIA (transient ischemic attack)   . Macular degeneration   . GERD (gastroesophageal reflux disease)   . Hypothyroid   . Osteoporosis   . Depression   . CAD (coronary artery disease) 03/23/2011  . Hyperlipidemia 03/23/2011  . CHEST PAIN UNSPECIFIED 09/24/2010  . History of GI bleed 12/27/2008     Past Surgical History  Procedure Laterality Date  . Thyroidectomy      for benign tumor  . Appendectomy    . Dilation and curettage of uterus    . Hospitalization  2007    global amnesia  . Hospitalization  12/2008    bleeding ulcer, asp PNA  . Hospitalization  2011    CP, r/o x 2 (neg cardiolite and ETT)  . Esophagogastroduodenoscopy  07/2009    LA grade C reflux esophagitis, HH, single gastric nodule ([place])  . Cholecystectomy    .  Colonoscopy  2006    normal per previous records     Current Outpatient Rx  Name  Route  Sig  Dispense  Refill  . alum & mag hydroxide-simeth (MAALOX/MYLANTA) 200-200-20 MG/5ML suspension   Oral   Take 5 mLs by mouth every 6 (six) hours as needed for indigestion or heartburn.         Marland Kitchen aspirin 81 MG chewable tablet   Oral   Chew 81 mg by mouth daily.         Marland Kitchen atorvastatin (LIPITOR) 20 MG tablet      TAKE ONE TABLET BY MOUTH EACH DAY AT HS . (IMPROVES CHOLESTEROL)   30 tablet   6   . citalopram (CELEXA) 20 MG tablet      TAKE 1 & 1/2 TABLETS BY MOUTH IN THE MORNING. (DEPRESSION)   45 tablet   6   . isosorbide mononitrate (IMDUR) 30 MG 24 hr tablet      TAKE ONE TABLET BY MOUTH TWICE DAILY FOR HEART.   60 tablet   6   . levothyroxine  (SYNTHROID, LEVOTHROID) 100 MCG tablet   Oral   Take 1 tablet (100 mcg total) by mouth daily before breakfast. On Wednesdays and Sundays only   15 tablet   6   . levothyroxine (SYNTHROID, LEVOTHROID) 50 MCG tablet      Take one daily, but not on Wednesdays or Sundays   30 tablet   11   . metoprolol tartrate (LOPRESSOR) 25 MG tablet      ONE TABLET BY MOUTH EVERY 12 HOURS FOR HIGH BLOOD PRESSURE.   60 tablet   3   . Multiple Vitamins-Minerals (PRESERVISION AREDS) TABS      TAKE ONE TABLET BY MOUTH ONCE DAILY IN THE AM FOR SUPPLEMENT   30 tablet   6   . pantoprazole (PROTONIX) 40 MG tablet      TAKE 1 TABLET BY MOUTH EACH MORNING. (GERD) **DO NOT CRUSH**   30 tablet   11   . ranitidine (ZANTAC) 150 MG capsule   Oral   Take 150 mg by mouth 2 (two) times daily.         . ranolazine (RANEXA) 500 MG 12 hr tablet   Oral   Take 1 tablet (500 mg total) by mouth 2 (two) times daily.   60 tablet   6   . traZODone (DESYREL) 50 MG tablet      TAKE 1/2 TABLET BY MOUTH EACH NIGHT AT BEDTIME. (SLEEP/DEPRESSION)   15 tablet   11   . vitamin B-12 (CYANOCOBALAMIN) 500 MCG tablet   Oral   Take 1 tablet (500 mcg total) by mouth daily.   90 tablet   3   . Vitamin D, Ergocalciferol, (DRISDOL) 50000 UNITS CAPS capsule   Oral   Take 1 capsule (50,000 Units total) by mouth every 7 (seven) days.   12 capsule   1   . Acetaminophen 500 MG coapsule   Oral   Take 1 capsule (500 mg total) by mouth 3 (three) times daily as needed for fever or pain.   90 capsule   0   . aspirin 81 MG EC tablet      TAKE 1 TABLET BY MOUTH EACH DAY. ANTI PLATELET AGGREGATION   30 tablet   3   . azithromycin (ZITHROMAX Z-PAK) 250 MG tablet      Take 2 tablets (500 mg) on  Day 1,  followed by 1 tablet (250 mg)  once daily on Days 2 through 5.   6 each   0   . benzonatate (TESSALON) 100 MG capsule   Oral   Take 1 capsule (100 mg total) by mouth 3 (three) times daily as needed for cough.   30  capsule   1   . loratadine (CLARITIN) 5 MG chewable tablet      5 mg a day for 1 week, increase to 10mg  a day if needed thereafter for runny nose.   60 tablet   12   . nitroGLYCERIN (NITROSTAT) 0.4 MG SL tablet   Sublingual   Place 0.4 mg under the tongue every 5 (five) minutes as needed.              Allergies Aricept; Codeine; and Penicillins   Family History  Problem Relation Age of Onset  . Cancer Mother     cervical  . Coronary artery disease Father   . Alcohol abuse Father   . Diabetes Paternal Aunt     Social History Social History  Substance Use Topics  . Smoking status: Never Smoker   . Smokeless tobacco: Never Used  . Alcohol Use: No    Review of Systems  Constitutional:   No fever or chills. No weight changes Eyes:   No blurry vision or double vision.  ENT:   No sore throat. Cardiovascular:   No chest pain. Respiratory:   No dyspnea , positive cough Gastrointestinal:   Negative for abdominal pain, vomiting and diarrhea.  No BRBPR or melena. Genitourinary:   Negative for dysuria, urinary retention, bloody urine, or difficulty urinating. Musculoskeletal:   Negative for back pain. No joint swelling or pain. Skin:   Negative for rash. Neurological:   Negative for headaches, focal weakness or numbness. Psychiatric:  No anxiety or depression.   Endocrine:  No hot/cold intolerance, changes in energy, or sleep difficulty.  10-point ROS otherwise negative.  ____________________________________________   PHYSICAL EXAM:  VITAL SIGNS: ED Triage Vitals  Enc Vitals Group     BP 09/08/15 0930 101/63 mmHg     Pulse Rate 09/08/15 0934 63     Resp 09/08/15 0934 18     Temp 09/08/15 0934 97.5 F (36.4 C)     Temp Source 09/08/15 0934 Oral     SpO2 --      Weight --      Height --      Head Cir --      Peak Flow --      Pain Score --      Pain Loc --      Pain Edu? --      Excl. in [place]? --      Constitutional:   Alert and oriented to person  and place. Well appearing and in no distress. Eyes:   No scleral icterus. No conjunctival pallor. PERRL. EOMI ENT   Head:   Normocephalic and atraumatic.   Nose:   No congestion/rhinnorhea. No septal hematoma   Mouth/Throat:   MMM, no pharyngeal erythema. No peritonsillar mass. No uvula shift.   Neck:   No stridor. No SubQ emphysema. No meningismus. Hematological/Lymphatic/Immunilogical:   No cervical lymphadenopathy. Cardiovascular:   RRR. Normal and symmetric distal pulses are present in all extremities. No murmurs, rubs, or gallops. Respiratory:   Normal respiratory effort without tachypnea nor retractions. Breath sounds are clear and equal bilaterally. No wheezes/rales/rhonchi. The patient has a raspy congested cough Gastrointestinal:   Soft and nontender. No distention. There is no  CVA tenderness.  No rebound, rigidity, or guarding. Genitourinary:   deferred Musculoskeletal:   Nontender with normal range of motion in all extremities. No joint effusions.  No lower extremity tenderness.  No edema. Neurologic:   Normal speech and language.  CN 2-10 normal. Motor grossly intact. No pronator drift.  Normal gait. No gross focal neurologic deficits are appreciated.  Skin:    Skin is warm, dry and intact. No rash noted.  No petechiae, purpura, or bullae. Psychiatric:   Mood and affect are normal. Speech and behavior are normal. Patient exhibits appropriate insight and judgment.  ____________________________________________    LABS (pertinent positives/negatives) (all labs ordered are listed, but only abnormal results are displayed) Labs Reviewed  URINALYSIS COMPLETEWITH MICROSCOPIC ([facility] ONLY) - Abnormal; Notable for the following:    Color, Urine YELLOW (*)    APPearance CLOUDY (*)    Squamous Epithelial / LPF 0-5 (*)    All other components within normal limits  BASIC METABOLIC PANEL - Abnormal; Notable for the following:    Sodium 132 (*)    Chloride 99 (*)     Glucose, Bld 106 (*)    Creatinine, Ser 1.20 (*)    GFR calc non Af Amer 39 (*)    GFR calc Af Amer 46 (*)    All other components within normal limits  CBC WITH DIFFERENTIAL/PLATELET - Abnormal; Notable for the following:    RBC 2.80 (*)    Hemoglobin 9.1 (*)    HCT 27.1 (*)    Monocytes Absolute 1.0 (*)    All other components within normal limits  TROPONIN I   ____________________________________________   EKG  Interpreted by me Normal sinus rhythm rate of 64, normal axis intervals QRS ST segments and T waves.  ____________________________________________    RADIOLOGY  Chest x-ray unremarkable  ____________________________________________   PROCEDURES   ____________________________________________   INITIAL IMPRESSION / ASSESSMENT AND PLAN / ED COURSE  Pertinent labs & imaging results that were available during my care of the patient were reviewed by me and considered in my medical decision making (see chart for details).  Patient presents with no acute complaints according to the patient, was sent to the ED for evaluation after falling out of bed. No evidence of any traumatic injury. No bony instability deformity or tenderness. Full range of motion in all joints. I suspect she likely has a mild mucoid pneumonia even though her vital signs are normal and her chest x-ray is clear. We'll start her on azithromycin to treat this. However follow-up with primary care this week. Her care was discussed with her son who agrees with the plan. No evidence of ACS PE aortic injury or sepsis.     ____________________________________________   FINAL CLINICAL IMPRESSION(S) / ED DIAGNOSES  Final diagnoses:  Community acquired pneumonia      Carrie Mew, MD 09/08/15 1304

## 2015-09-08 NOTE — ED Notes (Signed)
Family at bedside. 

## 2015-09-08 NOTE — ED Notes (Signed)
Pts family requested orthostatic vital. Pt was not able to stand safely so pt was not tested at standing position.

## 2015-09-17 ENCOUNTER — Encounter: Payer: Self-pay | Admitting: Cardiology

## 2015-09-17 ENCOUNTER — Ambulatory Visit (INDEPENDENT_AMBULATORY_CARE_PROVIDER_SITE_OTHER): Payer: Medicare Other | Admitting: Cardiology

## 2015-09-17 VITALS — BP 118/58 | HR 60 | Ht 61.0 in | Wt 121.5 lb

## 2015-09-17 DIAGNOSIS — R079 Chest pain, unspecified: Secondary | ICD-10-CM

## 2015-09-17 DIAGNOSIS — I25118 Atherosclerotic heart disease of native coronary artery with other forms of angina pectoris: Secondary | ICD-10-CM

## 2015-09-17 DIAGNOSIS — I951 Orthostatic hypotension: Secondary | ICD-10-CM

## 2015-09-17 DIAGNOSIS — E785 Hyperlipidemia, unspecified: Secondary | ICD-10-CM

## 2015-09-17 NOTE — Patient Instructions (Signed)
Medication Instructions:  Your physician has recommended you make the following change in your medication:  STOP taking atorvastatin DECREASE metoprolol to 1/2 tablet twice per day for 6 days then stop taking med    Labwork: none  Testing/Procedures: none  Follow-Up: Your physician recommends that you schedule a follow-up appointment in: November with Dr. Rockey Situ   Any Other Special Instructions Will Be Listed Below (If Applicable).

## 2015-09-17 NOTE — Progress Notes (Signed)
s  PCP: Becky Bush, MD Primary Cardiologies: Dr. Rockey Adkins.  Clinic Note: Chief Complaint  Patient presents with  . other    Follow up from [facility], pt. had a fall & chest pain. Meds reviewed by the patient's daughter in law.    . Coronary Artery Disease    HPI: Becky Adkins is a 81 y.o. female pt of Dr. Rockey Adkins  with a PMH below who presents today for urgent work-in.Marland Kitchen  Becky Adkins is a very pleasant 81 y.o. female with a history of 3V CAD treated medically, dementia, HTN, HLD, PMR (on chronic prednisone), hypothyroidism, h/o GIB/PUD, GERD, hiatal hernia, h/o normocytic anemia and h/o TIA who presents today for routine followup of her coronary artery disease. Recent history of angina.   Cardiac catheterization in 11/2010 at [facility] revealing 80% left main, 70% mid LAD, 90% mid LCx, 60% prox RCA, 50% dis RCA, 90% ostial PLB.  Echo 11/2010- EF > 55%, mild LVH, mild LA dilatation, mildly elevated RVSP (30-40 mmHg), mild TR.    Transferred to [facility] to consider CABG vs LM stent. Concern of sternotomy healing with chronic prednisone use. GIB in 12/2009, recurrent GIB with PCI requiring DAPT. the decision was made to treat medically.   Becky Adkins was last seen on 05/20/2015 Dr. Rockey Adkins.  She denied any anginal symptoms. Was noted to have low blood pressures. She has been treated for recurrent urinary tract infections with antibiotics.  Recent Hospitalizations: ER visit 09/08/2015 -- was noted to have an unwitnessed fall. No loss of consciousness. With this particular fall, did not have any significant injuries. She was recovering from a facial trauma suffered during a fall on August 28..  She ruled out for MI and sent home for followup.  She is thought to potentially have pneumonia and was treated with azithromycin.  Studies Reviewed: no new studies  Interval History: Becky Adkins presents today with her daughter-in-law who shares the duties  as main family caregiver for Becky Adkins. Becky Adkins herself is a poor story and due to dementia. Most of the story is that I was able to obtain was from the daughter-in-law. Apparently she had a fall on August 25,, and had a large bruise on her face. No other untoward symptoms were noted and therefore she was not taken to the hospital. When the fall occurred again on September 12, the nursing facility staff did call EMS and taken to the emergency room -- there was a report that she had mentioned something about chest pain with this episode, but nothing further was negative..  Apparently he she fell walking back from the bathroom. The family is very concerned that with hypotension she may very well have some orthostatic  Hypotension. They've been monitoring her blood pressure at the nursing facility and noted that the majority of the blood pressures are in the 100 382 mmHg mean systolic with some as low as 90/55. Despite having low blood pressure, heart rate still is stable in the 50-62 bpm range.    It does not appear that she actually lost consciousness. Unfortunately we are not able ascertain exactly what happened during her fall. She otherwise seems to be relatively thick but no issues. No notable complaints of chest tightness or pressure with rest or exertion to the extent of exertion she is able to do. No symptoms of PND, orthopnea but she does have mild edema. No reports of rapid irregular heartbeat/palpitations. She has had several falls, but unclear if it's true syncope or near syncope.  No TIA/amaurosis fugax symptoms. No melena, hematochezia, hematuria, or epstaxis. No claudication.  ROS: A comprehensive was performed. Review of Systems  Constitutional: Negative for malaise/fatigue.  Cardiovascular: Negative for claudication.  Musculoskeletal: Positive for myalgias and falls.       Limited mobility. Currently in wheelchair. Apparently does not walk much on her own  Skin:       Large healing  bruise on the right side of her face and orbit.  Neurological: Positive for dizziness and loss of consciousness (Unsure). Negative for sensory change, speech change, focal weakness and weakness.  Endo/Heme/Allergies: Bruises/bleeds easily.  All other systems reviewed and are negative.   Past Medical History  Diagnosis Date  . Hypertension   . Hyperlipidemia   . Polymyalgia rheumatica     on prednisone, has seen neuroophthalmologist, had normal temporal artery biopsy at dx  . History of GI bleed 12/2008    w/ submucosal gastric ulcers  . Dementia     mild, significant short term memory loss, eval by memory clinic and didn't think needed further meds  . Hiatal hernia   . Personal history of TIA (transient ischemic attack) 2009    neurology w/u with normal CT head, echo, carotid US  . Anemia     s/p hematology workup, h/o B12 shots  . Macular degeneration     and cataracts  . Deafness     right ear (mumps)  . GERD (gastroesophageal reflux disease)     severe on EGD with HH, H pylori neg  . Hypothyroid     s/p thyroidectomy  . Osteoporosis     was on boniva  . 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 Migraines     none recently  . Arthritis   . Depression   . Urinary incontinence   . CAD (coronary artery disease)     cath 12/11 at [facility]: severe 3-v CAD including LM 80% LAD 70% LCX 90% RCA 50-60%, decided medical treatment  . DNR (do not resuscitate) 12/2012    MOST form filled 12/2012, ok for limited interventions, ok for transfer to ER but try to avoid ICU   Past Surgical History  Procedure Laterality Date  . Thyroidectomy      for benign tumor  . Appendectomy    . Dilation and curettage of uterus    . Hospitalization  2007    global amnesia  . Hospitalization  12/2008    bleeding ulcer, asp PNA  . Hospitalization  2011    CP, r/o x 2 (neg cardiolite and ETT)  . Esophagogastroduodenoscopy  07/2009    LA grade C reflux esophagitis, HH,  single gastric nodule ([place])  . Cholecystectomy    . Colonoscopy  2006    normal per previous records   Prior to Admission medications   Medication Sig Start Date End Date Taking? Authorizing Provider  Acetaminophen 500 MG coapsule Take 1 capsule (500 mg total) by mouth 3 (three) times daily as needed for fever or pain. 04/24/12  Yes Becky Bush, MD  alum & mag hydroxide-simeth (MAALOX/MYLANTA) 200-200-20 MG/5ML suspension Take 5 mLs by mouth every 6 (six) hours as needed for indigestion or heartburn.   Yes Historical Provider, MD  aspirin 81 MG chewable tablet Chew 81 mg by mouth daily.   Yes Historical Provider, MD  aspirin 81 MG EC tablet TAKE 1 TABLET BY MOUTH EACH DAY. ANTI PLATELET AGGREGATION 11/01/13  Yes Becky Bush, MD  azithromycin (  ZITHROMAX Z-PAK) 250 MG tablet Take 2 tablets (500 mg) on  Day 1,  followed by 1 tablet (250 mg) once daily on Days 2 through 5. 09/08/15  Yes Carrie Mew, MD  benzonatate (TESSALON) 100 MG capsule Take 1 capsule (100 mg total) by mouth 3 (three) times daily as needed for cough. 08/15/15  Yes Tonia Ghent, MD  citalopram (CELEXA) 20 MG tablet TAKE 1 & 1/2 TABLETS BY MOUTH IN THE MORNING. (DEPRESSION) 08/23/13  Yes Becky Bush, MD  isosorbide mononitrate (IMDUR) 30 MG 24 hr tablet TAKE ONE TABLET BY MOUTH TWICE DAILY FOR HEART. 05/20/15  Yes Minna Merritts, MD  levothyroxine (SYNTHROID, LEVOTHROID) 100 MCG tablet Take 1 tablet (100 mcg total) by mouth daily before breakfast. On Wednesdays and Sundays only 03/24/15  Yes Becky Bush, MD  levothyroxine (SYNTHROID, LEVOTHROID) 50 MCG tablet Take one daily, but not on Wednesdays or Sundays 03/24/15  Yes Becky Bush, MD  loratadine (CLARITIN) 5 MG chewable tablet 5 mg a day for 1 week, increase to 10mg  a day if needed thereafter for runny nose. 12/25/14  Yes Tonia Ghent, MD  Multiple Vitamins-Minerals (PRESERVISION AREDS) TABS TAKE ONE TABLET BY MOUTH ONCE DAILY IN THE AM FOR  SUPPLEMENT 02/19/14  Yes Becky Bush, MD  nitroGLYCERIN (NITROSTAT) 0.4 MG SL tablet Place 0.4 mg under the tongue every 5 (five) minutes as needed.     Yes Historical Provider, MD  pantoprazole (PROTONIX) 40 MG tablet TAKE 1 TABLET BY MOUTH EACH MORNING. (GERD) **DO NOT CRUSH** 10/24/13  Yes Becky Bush, MD  ranitidine (ZANTAC) 150 MG capsule Take 150 mg by mouth 2 (two) times daily.   Yes Historical Provider, MD  ranolazine (RANEXA) 500 MG 12 hr tablet Take 1 tablet (500 mg total) by mouth 2 (two) times daily. 02/11/14  Yes Minna Merritts, MD  traZODone (DESYREL) 50 MG tablet TAKE 1/2 TABLET BY MOUTH EACH NIGHT AT BEDTIME. (SLEEP/DEPRESSION)   Yes Becky Bush, MD   Allergies  Allergen Reactions  . Aricept [Donepezil Hydrochloride] Other (See Comments)    Agitated, aggressive  . Codeine     Nausea   . Penicillins     Rash     Social History   Social History  . Marital Status: Widowed    Spouse Name: N/A  . Number of Children: N/A  . Years of Education: N/A   Occupational History  . retired     Marine scientist   Social History Main Topics  . Smoking status: Never Smoker   . Smokeless tobacco: Never Used  . Alcohol Use: No  . Drug Use: No  . Sexual Activity: Not Asked   Other Topics Concern  . None   Social History Narrative   Caffeine: 3 cups/day   Lives in [place] in [place]   Son lives in [place]   Daughter in Sports coach, Diane, Therapist, sports at [place]: good diet, cardiac diet prescribed   Family History  Problem Relation Age of Onset  . Cancer Mother     cervical  . Coronary artery disease Father   . Alcohol abuse Father   . Diabetes Paternal Aunt      Wt Readings from Last 3 Encounters:  09/17/15 121 lb 8 oz (55.112 kg)  08/15/15 121 lb 8 oz (55.112 kg)  05/20/15 133 lb 8 oz (60.555 kg)    PHYSICAL EXAM BP 118/58 mmHg  Pulse 60  Ht 5\' 1"  (1.549 m)  Wt 121 lb 8  oz (55.112 kg)  BMI 22.97 kg/m2 General appearance: alert, cooperative,  appears stated age, no distress.  Well-nourished and well-groomed. HEENT: extensive well-healed ecchymotic bruise on the right side of her face.; , EOMI, MMM, anicteric sclera Neck: no adenopathy, no carotid bruit and no JVD Lungs: CTAB, normal percussion bilaterally and non-labored Heart: RRR S1, S2 normal, no click, rub or gallop.  Nondisplaced PMI. 2/6 SEM at RUSB.  Abdomen: soft, non-tender; bowel sounds normal; no masses,  no organomegaly;  Extremities: extremities normal, atraumatic, no cyanosis, and  Trace edema  Pulses: 2+ and symmetric; Skin: normal or bruise on R side of face Neurologic: Mental status: Alert, oriented to person and place. She does have tangential speech but normal affect. Does not answer questions appropriately. Cranial nerves: normal (II-XII grossly intact)    Adult ECG Report  Rate: 60 ;  Rhythm: normal sinus rhythm, premature ventricular contractions (PVC) and Borderline low voltage. Otherwise normal axis and intervals. QTc is 502 - borderline prolonged period;   Narrative Interpretation: mostly normal EKG   Other studies Reviewed: Additional studies/ records that were reviewed today include:  Recent Labs:   Lab Results  Component Value Date   CREATININE 1.20* 09/08/2015     ASSESSMENT / PLAN: Problem List Items Addressed This Visit    Coronary artery diseas severe multivessel with Left Main disease.. Plan medical management. (Chronic)    Currently no further symptoms. We'll continue with the Imdur, but stopped Lopressor. She is on Ranexa which is not that low pressure. She has a further angina, I would preferentially use Imdur Ranexa and try to avoid beta blocker as it still has antihypertensive effect. Currently essentially angina free.      Hyperlipidemia with target LDL less than 70 (Chronic)    At this point with her age, I don't think long-term benefits of the statin a slow sense. With her muscle aches and weakness as well as dizziness, I think  it would just be due to simply stop this medication. She is on so many medications and try to avoid interactions and adverse effects is reasonable.      Orthostatic hypotension    She does have significant orthostatic hypotension and has had several falls now. At this point in time I think she is proven to have a low baseline blood pressures and had multiple falls. I think it's not a reason to simply just stop any blood pressure medications.  Plan: Stop Lopressor -- starting with one half tablet twice a day for one week and then completely discontinue.       Other Visit Diagnoses    Chest pain, unspecified chest pain type    -  Primary    Relevant Orders    EKG 12-Lead (Completed)       Current medicines are reviewed at length with the patient today. (+/- concerns) polypharmacy The following changes have been made:   Medication Instructions:  Your physician has recommended you make the following change in your medication:  STOP taking atorvastatin DECREASE metoprolol to 1/2 tablet twice per day for 6 days then stop taking med  Studies Ordered:   Orders Placed This Encounter  Procedures  . EKG 12-Lead      Leonie Man, M.D., M.S. Interventional Cardiologist   Pager # [phone]

## 2015-09-19 ENCOUNTER — Encounter: Payer: Self-pay | Admitting: Cardiology

## 2015-09-19 DIAGNOSIS — E785 Hyperlipidemia, unspecified: Secondary | ICD-10-CM | POA: Insufficient documentation

## 2015-09-19 NOTE — Assessment & Plan Note (Signed)
At this point with her age, I don't think long-term benefits of the statin a slow sense. With her muscle aches and weakness as well as dizziness, I think it would just be due to simply stop this medication. She is on so many medications and try to avoid interactions and adverse effects is reasonable.

## 2015-09-19 NOTE — Assessment & Plan Note (Addendum)
Currently no further symptoms. We'll continue with the Imdur, but stopped Lopressor. She is on Ranexa which is not that low pressure. She has a further angina, I would preferentially use Imdur Ranexa and try to avoid beta blocker as it still has antihypertensive effect. Currently essentially angina free.

## 2015-09-19 NOTE — Assessment & Plan Note (Signed)
She does have significant orthostatic hypotension and has had several falls now. At this point in time I think she is proven to have a low baseline blood pressures and had multiple falls. I think it's not a reason to simply just stop any blood pressure medications.  Plan: Stop Lopressor -- starting with one half tablet twice a day for one week and then completely discontinue.

## 2015-10-02 ENCOUNTER — Ambulatory Visit: Payer: Medicare Other | Admitting: Physician Assistant

## 2015-10-29 ENCOUNTER — Other Ambulatory Visit: Payer: Self-pay | Admitting: Family Medicine

## 2015-10-29 ENCOUNTER — Ambulatory Visit (INDEPENDENT_AMBULATORY_CARE_PROVIDER_SITE_OTHER)
Admission: RE | Admit: 2015-10-29 | Discharge: 2015-10-29 | Disposition: A | Discharge status: Home / Self Care | Payer: Medicare Other | Source: Ambulatory Visit | Attending: Family Medicine | Admitting: Family Medicine

## 2015-10-29 ENCOUNTER — Ambulatory Visit (INDEPENDENT_AMBULATORY_CARE_PROVIDER_SITE_OTHER): Payer: Medicare Other | Admitting: Family Medicine

## 2015-10-29 ENCOUNTER — Encounter: Payer: Self-pay | Admitting: Family Medicine

## 2015-10-29 ENCOUNTER — Ambulatory Visit: Payer: Medicare Other | Admitting: Primary Care

## 2015-10-29 VITALS — BP 130/82 | HR 77 | Temp 98.2°F | Ht 61.0 in | Wt 135.4 lb

## 2015-10-29 DIAGNOSIS — R079 Chest pain, unspecified: Secondary | ICD-10-CM | POA: Insufficient documentation

## 2015-10-29 DIAGNOSIS — R509 Fever, unspecified: Secondary | ICD-10-CM

## 2015-10-29 LAB — CBC
HCT: 25.8 % — ABNORMAL LOW (ref 36.0–46.0)
Hemoglobin: 8.6 g/dL — ABNORMAL LOW (ref 12.0–15.0)
MCHC: 32.7 g/dL (ref 30.0–36.0)
MCV: 96.9 fl (ref 78.0–100.0)
Platelets: 254 10*3/uL (ref 150.0–400.0)
RBC: 2.67 Mil/uL — AB (ref 3.87–5.11)
RDW: 15.2 % (ref 11.5–15.5)
WBC: 8.5 10*3/uL (ref 4.0–10.5)

## 2015-10-29 LAB — COMPREHENSIVE METABOLIC PANEL
ALK PHOS: 47 U/L (ref 39–117)
ALT: 8 U/L (ref 0–35)
AST: 22 U/L (ref 0–37)
Albumin: 3.2 g/dL — ABNORMAL LOW (ref 3.5–5.2)
BILIRUBIN TOTAL: 0.5 mg/dL (ref 0.2–1.2)
BUN: 25 mg/dL — ABNORMAL HIGH (ref 6–23)
CALCIUM: 9.3 mg/dL (ref 8.4–10.5)
CO2: 25 mEq/L (ref 19–32)
Chloride: 97 mEq/L (ref 96–112)
Creatinine, Ser: 1.71 mg/dL — ABNORMAL HIGH (ref 0.40–1.20)
GFR: 29.95 mL/min — AB (ref >60.00)
Glucose, Bld: 124 mg/dL — ABNORMAL HIGH (ref 70–99)
POTASSIUM: 4.7 meq/L (ref 3.5–5.1)
SODIUM: 130 meq/L — AB (ref 135–145)
TOTAL PROTEIN: 8.3 g/dL (ref 6.0–8.3)

## 2015-10-29 LAB — TROPONIN I: TNIDX: 0.03 ug/L (ref 0.00–0.06)

## 2015-10-29 NOTE — Addendum Note (Signed)
Addended by: Coral Spikes on: 10/29/2015 03:58 PM   Modules accepted: Level of Service

## 2015-10-29 NOTE — Progress Notes (Addendum)
Subjective:  Patient ID: Becky Adkins, female    DOB: 1925-01-30  Age: 81 y.o. MRN: [id number]  CC: Fever, chest pain  HPI:  81 year old female with a complicated past medical history including TIA, osteoporosis, severe multivessel CAD, dementia, CKD presents to the clinic today for an acute visit with reports of recent fever and chest pain.  History Limited given advanced dementia. History obtained from daughter-in-law.  Daughter-in-law reports that the staff at her nursing home reported that she was complaining of chest pain earlier today. She was given Mylanta as well as nitroglycerin with improvement in her pain. She also reports that they stated that she did not look well and was not acting like her normal self. Additionally, they reported she had a fever (100.2).    Patient currently has no complaints. No reports of pain or shortness of breath. Daughter-in-law states that she doesn't quite look like herself. No recent illness. Daughter law does state that the nursing home also told her that she choked on a piece of bacon this morning. No other reported symptoms. She has lost weight recently due to advancing dementia. He is a DO NOT RESUSCITATE.  Social Hx   Social History   Social History  . Marital Status: Widowed    Spouse Name: N/A  . Number of Children: N/A  . Years of Education: N/A   Occupational History  . retired     Marine scientist   Social History Main Topics  . Smoking status: Never Smoker   . Smokeless tobacco: Never Used  . Alcohol Use: No  . Drug Use: No  . Sexual Activity: Not Asked   Other Topics Concern  . None   Social History Narrative   Caffeine: 3 cups/day   Lives in [place] in [place]   Son lives in [place]   Daughter in Sports coach, Diane, Therapist, sports at [place]: good diet, cardiac diet prescribed   Review of Systems  Constitutional: Negative for fever.  Cardiovascular: Positive for chest pain.   Objective:  BP 130/82 mmHg   Pulse 77  Temp(Src) 98.2 F (36.8 C) (Oral)  Ht 5\' 1"  (1.549 m)  Wt 135 lb 6 oz (61.406 kg)  BMI 25.59 kg/m2  SpO2 95%  BP/Weight 10/29/2015 09/17/2015 1/49/7026  Systolic BP 378 588 502  Diastolic BP 82 58 60  Wt. (Lbs) 135.38 121.5 -  BMI 25.59 22.97 -   Physical Exam  Constitutional:  Chronically ill appearing female; NAD.   HENT:  Head: Normocephalic and atraumatic.  Cardiovascular: Normal rate and regular rhythm.   Pulmonary/Chest:  Right basilar crackles.   Abdominal: Soft. She exhibits no distension. There is no tenderness. There is no rebound and no guarding.  Neurological:  Awake and Alert. No oriented due to dementia.  Psychiatric: She has a normal mood and affect.  Vitals reviewed.  Lab Results  Component Value Date   WBC 8.5 10/29/2015   HGB 8.6* 10/29/2015   HCT 25.8* 10/29/2015   PLT 254.0 10/29/2015   GLUCOSE 124* 10/29/2015   CHOL 149 12/09/2014   TRIG 150.0* 12/09/2014   HDL 32.10* 12/09/2014   LDLCALC 87 12/09/2014   ALT 8 10/29/2015   AST 22 10/29/2015   NA 130* 10/29/2015   K 4.7 10/29/2015   CL 97 10/29/2015   CREATININE 1.71* 10/29/2015   BUN 25* 10/29/2015   CO2 25 10/29/2015   TSH 9.86* 03/20/2015   MICROALBUR 0.8 03/29/2012    Assessment & Plan:  Problem List Items Addressed This Visit    Fever - Primary    Elevated temperature and changes in appearance/status per nursing home staff. Chest x-ray was obtained and reviewed independently by myself. Chest x-ray with no acute findings. Cardiomegaly was noted. Awaiting urinalysis as patient has had some difficulty voiding as she has recently voided. Labs: Creatinine was mildly elevated from prior. This is likely from poor by mouth intake due to advancing dementia. Hemoglobin stable at 8.6. Remainder labs unremarkable. Daughter-in-law was informed of all of the above.      Relevant Orders   POCT Urinalysis Dipstick   CBC (Completed)   Comprehensive metabolic panel (Completed)    Troponin I (Completed)   Chest pain    Resolved follow treatment with Mylanta and Nitro. Could be from angina given severe CAD.  Given dementia and comorbidities, she is not a candidate for invasive treatment. I advised continued use of nitroglycerin as needed.  Troponin negative.      Relevant Orders   CBC (Completed)   Comprehensive metabolic panel (Completed)   Troponin I (Completed)     Follow-up: PRN  Thersa Salt, DO

## 2015-10-29 NOTE — Progress Notes (Signed)
Pre visit review using our clinic review tool, if applicable. No additional management support is needed unless otherwise documented below in the visit note. 

## 2015-10-29 NOTE — Assessment & Plan Note (Addendum)
Resolved follow treatment with Mylanta and Nitro. Could be from angina given severe CAD.  Given dementia and comorbidities, she is not a candidate for invasive treatment. I advised continued use of nitroglycerin as needed.  Troponin negative.

## 2015-10-29 NOTE — Patient Instructions (Signed)
We will call with the results.  Follow up closely with Dr. Darnell Level.  Take care  Dr. Lacinda Axon

## 2015-10-29 NOTE — Assessment & Plan Note (Addendum)
Elevated temperature and changes in appearance/status per nursing home staff. Chest x-ray was obtained and reviewed independently by myself. Chest x-ray with no acute findings. Cardiomegaly was noted. Awaiting urinalysis as patient has had some difficulty voiding as she has recently voided. Labs: Creatinine was mildly elevated from prior. This is likely from poor by mouth intake due to advancing dementia. Hemoglobin stable at 8.6. Remainder labs unremarkable. Daughter-in-law was informed of all of the above.

## 2015-10-31 LAB — POCT URINALYSIS DIPSTICK
Bilirubin, UA: NEGATIVE
Blood, UA: NEGATIVE
Glucose, UA: NEGATIVE
Ketones, UA: NEGATIVE
NITRITE UA: NEGATIVE
PH UA: 5
Spec Grav, UA: >=1.03
Urobilinogen, UA: 0.2

## 2015-10-31 NOTE — Addendum Note (Signed)
Addended by: Karlene Einstein D on: 10/31/2015 04:03 PM   Modules accepted: Orders

## 2015-11-01 LAB — URINE CULTURE

## 2015-11-07 ENCOUNTER — Telehealth: Payer: Self-pay | Admitting: Family Medicine

## 2015-11-07 NOTE — Telephone Encounter (Signed)
Noted  

## 2015-11-07 NOTE — Telephone Encounter (Signed)
Becky Adkins has an appt with you on 12/11/15 for CPE, please notate when you see the patient also that you are following up with a Face To Face for nursing eval with [facility] for her fever and choking at [facility]. Signed order on 11/07/15 in office with [facility].

## 2015-11-17 ENCOUNTER — Encounter: Payer: Self-pay | Admitting: Cardiovascular Disease

## 2015-11-17 ENCOUNTER — Ambulatory Visit (INDEPENDENT_AMBULATORY_CARE_PROVIDER_SITE_OTHER): Payer: Medicare Other | Admitting: Cardiovascular Disease

## 2015-11-17 VITALS — BP 128/62 | HR 75 | Ht 62.0 in | Wt 112.0 lb

## 2015-11-17 DIAGNOSIS — I10 Essential (primary) hypertension: Secondary | ICD-10-CM | POA: Diagnosis not present

## 2015-11-17 DIAGNOSIS — I25118 Atherosclerotic heart disease of native coronary artery with other forms of angina pectoris: Secondary | ICD-10-CM | POA: Diagnosis not present

## 2015-11-17 DIAGNOSIS — I209 Angina pectoris, unspecified: Secondary | ICD-10-CM | POA: Diagnosis not present

## 2015-11-17 DIAGNOSIS — F0390 Unspecified dementia without behavioral disturbance: Secondary | ICD-10-CM | POA: Diagnosis not present

## 2015-11-17 DIAGNOSIS — E785 Hyperlipidemia, unspecified: Secondary | ICD-10-CM

## 2015-11-17 DIAGNOSIS — D649 Anemia, unspecified: Secondary | ICD-10-CM

## 2015-11-17 MED ORDER — ATORVASTATIN CALCIUM 20 MG PO TABS: 20.0000 mg | ORAL_TABLET | Freq: Every day | ORAL | Status: DC

## 2015-11-17 NOTE — Assessment & Plan Note (Signed)
Currently at [facility], happy, and relates with a walker

## 2015-11-17 NOTE — Assessment & Plan Note (Signed)
Denies any symptoms concerning for angina. We'll restart Lipitor. Son does not report any changes by holding the statin Few options for her severe coronary disease apart from statin and aspirin

## 2015-11-17 NOTE — Progress Notes (Signed)
Patient ID: Becky Adkins, female    DOB: November 14, 1925, 81 y.o.   MRN: [id number]  HPI Comments: Becky Adkins is a very pleasant 81 y.o. female  with a history of 3V CAD treated medically, dementia, HTN, HLD, PMR (on chronic prednisone), hypothyroidism, h/o GIB/PUD, GERD, hiatal hernia, h/o normocytic anemia and h/o TIA who presents today for routine followup of her coronary artery disease. Recent history of angina. She is a resident of [name].  she presents with her son. In follow-up today, she has lost 20 pounds compared to her prior clinic visit in May 2016 Weight has dropped from 133 pounds down to 112 pounds on today's visit Recent blood work showing creatinine 1.7, BUN 25, hematocrit 25.8 Currently not taking iron She denies any symptoms concerning for angina, no chest pain or shortness of breath on exertion No recent falls, no new complaints Lipitor held in September by Dr. Ellyn Hack Currently walks with a walker Son did not notice any difference by holding the Lipitor Sinus concerned about polyuria, borderline elevated sugars  EKG on today's visit shows normal sinus rhythm with rate 75 bpm, no significant ST or T-wave changes  Other past medical history  cardiac catheterization in 11/2010 at [facility] revealing 80% left main, 70% mid LAD, 90% mid LCx, 60% prox RCA, 50% dis RCA, 90% ostial PLB.  Echo 11/2010- EF > 55%, mild LVH, mild LA dilatation, mildly elevated RVSP (30-40 mmHg), mild TR.    transferred to [facility] to consider CABG vs LM stent.  Concern of sternotomy healing with chronic prednisone use. GIB in 12/2009,  recurrent GIB with PCI requiring DAPT.  the decision was made to treat medically.   TSH normal at 1.01 in 07/2013.  Hgb 9.2/Hct 27.5, MCV 91.9 in 07/2013      Allergies  Allergen Reactions  . Aricept [Donepezil Hydrochloride] Other (See Comments)    Agitated, aggressive  . Codeine     Nausea   . Penicillins     Rash     Outpatient  Encounter Prescriptions as of 11/17/2015  Medication Sig  . acetaminophen (TYLENOL) 325 MG tablet Take 650 mg by mouth every 6 (six) hours as needed.  Marland Kitchen alum & mag hydroxide-simeth (MAALOX/MYLANTA) 200-200-20 MG/5ML suspension Take 5 mLs by mouth every 6 (six) hours as needed for indigestion or heartburn.  Marland Kitchen aspirin 81 MG chewable tablet Chew 81 mg by mouth daily.  Marland Kitchen aspirin 81 MG EC tablet TAKE 1 TABLET BY MOUTH EACH DAY. ANTI PLATELET AGGREGATION  . benzonatate (TESSALON) 100 MG capsule Take 1 capsule (100 mg total) by mouth 3 (three) times daily as needed for cough.  . citalopram (CELEXA) 20 MG tablet TAKE 1 & 1/2 TABLETS BY MOUTH IN THE MORNING. (DEPRESSION)  . isosorbide mononitrate (IMDUR) 30 MG 24 hr tablet TAKE ONE TABLET BY MOUTH TWICE DAILY FOR HEART.  Marland Kitchen levothyroxine (SYNTHROID, LEVOTHROID) 100 MCG tablet Take 1 tablet (100 mcg total) by mouth daily before breakfast. On Wednesdays and Sundays only  . levothyroxine (SYNTHROID, LEVOTHROID) 50 MCG tablet Take one daily, but not on Wednesdays or Sundays  . loratadine (CLARITIN) 5 MG chewable tablet 5 mg a day for 1 week, increase to 10mg  a day if needed thereafter for runny nose.  . Multiple Vitamins-Minerals (PRESERVISION AREDS) TABS TAKE ONE TABLET BY MOUTH ONCE DAILY IN THE AM FOR SUPPLEMENT  . nitroGLYCERIN (NITROSTAT) 0.4 MG SL tablet Place 0.4 mg under the tongue every 5 (five) minutes as needed.    Marland Kitchen  pantoprazole (PROTONIX) 40 MG tablet TAKE 1 TABLET BY MOUTH EACH MORNING. (GERD) **DO NOT CRUSH**  . ranitidine (ZANTAC) 150 MG capsule Take 150 mg by mouth 2 (two) times daily.  . ranolazine (RANEXA) 500 MG 12 hr tablet Take 1 tablet (500 mg total) by mouth 2 (two) times daily.  . traZODone (DESYREL) 50 MG tablet TAKE 1/2 TABLET BY MOUTH EACH NIGHT AT BEDTIME. (SLEEP/DEPRESSION)  . [DISCONTINUED] Acetaminophen 500 MG coapsule Take 1 capsule (500 mg total) by mouth 3 (three) times daily as needed for fever or pain. (Patient not taking:  Reported on 11/17/2015)   No facility-administered encounter medications on file as of 11/17/2015.    Past Medical History  Diagnosis Date  . Hypertension   . Hyperlipidemia   . Polymyalgia rheumatica (HCC)     on prednisone, has seen neuroophthalmologist, had normal temporal artery biopsy at dx  . History of GI bleed 12/2008    w/ submucosal gastric ulcers  . Dementia     mild, significant short term memory loss, eval by memory clinic and didn't think needed further meds  . Hiatal hernia   . Personal history of TIA (transient ischemic attack) 2009    neurology w/u with normal CT head, echo, carotid US  . Anemia     s/p hematology workup, h/o B12 shots  . Macular degeneration     and cataracts  . Deafness     right ear (mumps)  . GERD (gastroesophageal reflux disease)     severe on EGD with HH, H pylori neg  . Hypothyroid     s/p thyroidectomy  . Osteoporosis     was on boniva  . 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 Migraines     none recently  . Arthritis   . Depression   . Urinary incontinence   . CAD (coronary artery disease)     cath 12/11 at [facility]: severe 3-v CAD including LM 80% LAD 70% LCX 90% RCA 50-60%, decided medical treatment  . DNR (do not resuscitate) 12/2012    MOST form filled 12/2012, ok for limited interventions, ok for transfer to ER but try to avoid ICU    Past Surgical History  Procedure Laterality Date  . Thyroidectomy      for benign tumor  . Appendectomy    . Dilation and curettage of uterus    . Hospitalization  2007    global amnesia  . Hospitalization  12/2008    bleeding ulcer, asp PNA  . Hospitalization  2011    CP, r/o x 2 (neg cardiolite and ETT)  . Esophagogastroduodenoscopy  07/2009    LA grade C reflux esophagitis, HH, single gastric nodule ([place])  . Cholecystectomy    . Colonoscopy  2006    normal per previous records    Social History  reports that she has never smoked. She has never  used smokeless tobacco. She reports that she does not drink alcohol or use illicit drugs.  Family History family history includes Alcohol abuse in her father; Cancer in her mother; Coronary artery disease in her father; Diabetes in her paternal aunt.   Review of Systems  Constitutional: Positive for appetite change and unexpected weight change.  Respiratory: Negative.   Cardiovascular: Negative.   Gastrointestinal: Negative.   Musculoskeletal: Positive for gait problem.  Neurological: Negative.   Hematological: Negative.   Psychiatric/Behavioral: Positive for confusion.  All other systems reviewed and are negative.  BP 128/62 mmHg  Pulse 75  Ht 5\' 2"  (1.575 m)  Wt 112 lb (50.803 kg)  BMI 20.48 kg/m2  Physical Exam  Constitutional: She is oriented to person, place, and time. She appears well-developed and well-nourished.  HENT:  Head: Normocephalic.  Nose: Nose normal.  Mouth/Throat: Oropharynx is clear and moist.  Eyes: Conjunctivae are normal. Pupils are equal, round, and reactive to light.  Neck: Normal range of motion. Neck supple. No JVD present.  Cardiovascular: Normal rate, regular rhythm, S1 normal, S2 normal and intact distal pulses.  Exam reveals no gallop and no friction rub.   Murmur heard.  Systolic murmur is present with a grade of 2/6  Pulmonary/Chest: Effort normal and breath sounds normal. No respiratory distress. She has no wheezes. She has no rales. She exhibits no tenderness.  Abdominal: Soft. Bowel sounds are normal. She exhibits no distension. There is no tenderness.  Musculoskeletal: Normal range of motion. She exhibits no edema or tenderness.  Lymphadenopathy:    She has no cervical adenopathy.  Neurological: She is alert and oriented to person, place, and time. Coordination normal.  Skin: Skin is warm and dry. No rash noted. No erythema.  Psychiatric: She has a normal mood and affect. Her behavior is normal. Judgment and thought content normal.     Assessment and Plan  Nursing note and vitals reviewed.

## 2015-11-17 NOTE — Patient Instructions (Addendum)
Please restart your Lipitor 20 mg daily  Please increase your fluids and eat more!  Please call us if you have new issues that need to be addressed before your next appt.  Your physician wants you to follow-up in: 6 months.  You will receive a reminder letter in the mail two months in advance. If you don't receive a letter, please call our office to schedule the follow-up appointment.

## 2015-11-17 NOTE — Assessment & Plan Note (Signed)
Blood pressure is well controlled on today's visit. No changes made to the medications. 

## 2015-11-17 NOTE — Assessment & Plan Note (Signed)
We'll restart her statin as above. Previously was at goal

## 2015-11-17 NOTE — Assessment & Plan Note (Signed)
Suspect iron deficiency anemia Currently not taking iron. Was taking this at least every other day in the past Blood count continues to drop Son prefers to discuss this with Dr. Danise Mina, iron panel before starting iron

## 2015-11-17 NOTE — Assessment & Plan Note (Signed)
Medications as above, currently without significant angina

## 2015-12-11 ENCOUNTER — Encounter: Payer: Medicare Other | Admitting: Family Medicine

## 2015-12-17 ENCOUNTER — Encounter: Payer: Self-pay | Admitting: Family Medicine

## 2015-12-17 ENCOUNTER — Ambulatory Visit (INDEPENDENT_AMBULATORY_CARE_PROVIDER_SITE_OTHER): Payer: Medicare Other | Admitting: Family Medicine

## 2015-12-17 VITALS — BP 118/60 | HR 74 | Temp 97.7°F | Ht 61.0 in | Wt 113.0 lb

## 2015-12-17 DIAGNOSIS — Z7189 Other specified counseling: Secondary | ICD-10-CM

## 2015-12-17 DIAGNOSIS — F0390 Unspecified dementia without behavioral disturbance: Secondary | ICD-10-CM

## 2015-12-17 DIAGNOSIS — D631 Anemia in chronic kidney disease: Secondary | ICD-10-CM

## 2015-12-17 DIAGNOSIS — M810 Age-related osteoporosis without current pathological fracture: Secondary | ICD-10-CM

## 2015-12-17 DIAGNOSIS — Z8719 Personal history of other diseases of the digestive system: Secondary | ICD-10-CM

## 2015-12-17 DIAGNOSIS — M353 Polymyalgia rheumatica: Secondary | ICD-10-CM

## 2015-12-17 DIAGNOSIS — R634 Abnormal weight loss: Secondary | ICD-10-CM

## 2015-12-17 DIAGNOSIS — K219 Gastro-esophageal reflux disease without esophagitis: Secondary | ICD-10-CM

## 2015-12-17 DIAGNOSIS — Z66 Do not resuscitate: Secondary | ICD-10-CM

## 2015-12-17 DIAGNOSIS — N183 Chronic kidney disease, stage 3 unspecified: Secondary | ICD-10-CM

## 2015-12-17 DIAGNOSIS — R8290 Unspecified abnormal findings in urine: Secondary | ICD-10-CM

## 2015-12-17 DIAGNOSIS — I25118 Atherosclerotic heart disease of native coronary artery with other forms of angina pectoris: Secondary | ICD-10-CM | POA: Diagnosis not present

## 2015-12-17 DIAGNOSIS — E039 Hypothyroidism, unspecified: Secondary | ICD-10-CM | POA: Diagnosis not present

## 2015-12-17 DIAGNOSIS — E785 Hyperlipidemia, unspecified: Secondary | ICD-10-CM | POA: Diagnosis not present

## 2015-12-17 DIAGNOSIS — Z0000 Encounter for general adult medical examination without abnormal findings: Secondary | ICD-10-CM

## 2015-12-17 DIAGNOSIS — E538 Deficiency of other specified B group vitamins: Secondary | ICD-10-CM | POA: Diagnosis not present

## 2015-12-17 DIAGNOSIS — I10 Essential (primary) hypertension: Secondary | ICD-10-CM

## 2015-12-17 DIAGNOSIS — N189 Chronic kidney disease, unspecified: Secondary | ICD-10-CM

## 2015-12-17 DIAGNOSIS — E43 Unspecified severe protein-calorie malnutrition: Secondary | ICD-10-CM | POA: Insufficient documentation

## 2015-12-17 DIAGNOSIS — F331 Major depressive disorder, recurrent, moderate: Secondary | ICD-10-CM

## 2015-12-17 LAB — TSH: TSH: 23.69 u[IU]/mL — ABNORMAL HIGH (ref 0.35–4.50)

## 2015-12-17 LAB — POCT URINALYSIS DIPSTICK
BILIRUBIN UA: NEGATIVE
GLUCOSE UA: NEGATIVE
KETONES UA: NEGATIVE
NITRITE UA: NEGATIVE
PH UA: 6
Protein, UA: NEGATIVE
RBC UA: NEGATIVE
Spec Grav, UA: 1.025
Urobilinogen, UA: 0.2

## 2015-12-17 LAB — CBC WITH DIFFERENTIAL/PLATELET
BASOS ABS: 0 10*3/uL (ref 0.0–0.1)
Basophils Relative: 0.6 % (ref 0.0–3.0)
EOS ABS: 0.2 10*3/uL (ref 0.0–0.7)
Eosinophils Relative: 2.7 % (ref 0.0–5.0)
HEMATOCRIT: 27.5 % — AB (ref 36.0–46.0)
HEMOGLOBIN: 9.1 g/dL — AB (ref 12.0–15.0)
LYMPHS PCT: 38.4 % (ref 12.0–46.0)
Lymphs Abs: 2.2 10*3/uL (ref 0.7–4.0)
MCHC: 33.2 g/dL (ref 30.0–36.0)
MCV: 97.3 fl (ref 78.0–100.0)
MONOS PCT: 11 % (ref 3.0–12.0)
Monocytes Absolute: 0.6 10*3/uL (ref 0.1–1.0)
NEUTROS ABS: 2.7 10*3/uL (ref 1.4–7.7)
Neutrophils Relative %: 47.3 % (ref 43.0–77.0)
Platelets: 320 10*3/uL (ref 150.0–400.0)
RBC: 2.83 Mil/uL — AB (ref 3.87–5.11)
RDW: 15.7 % — ABNORMAL HIGH (ref 11.5–15.5)
WBC: 5.7 10*3/uL (ref 4.0–10.5)

## 2015-12-17 LAB — IBC PANEL
IRON: 41 ug/dL — AB (ref 42–145)
Saturation Ratios: 13.4 % — ABNORMAL LOW (ref 20.0–50.0)
TRANSFERRIN: 219 mg/dL (ref 212.0–360.0)

## 2015-12-17 LAB — FERRITIN: FERRITIN: 16.1 ng/mL (ref 10.0–291.0)

## 2015-12-17 LAB — LIPID PANEL
CHOL/HDL RATIO: 8
Cholesterol: 256 mg/dL — ABNORMAL HIGH (ref 0–200)
HDL: 32.9 mg/dL — AB (ref >39.00)
LDL Cholesterol: 183 mg/dL — ABNORMAL HIGH (ref 0–99)
NONHDL: 222.64
Triglycerides: 197 mg/dL — ABNORMAL HIGH (ref 0.0–149.0)
VLDL: 39.4 mg/dL (ref 0.0–40.0)

## 2015-12-17 LAB — RENAL FUNCTION PANEL
Albumin: 3.4 g/dL — ABNORMAL LOW (ref 3.5–5.2)
BUN: 21 mg/dL (ref 6–23)
CHLORIDE: 100 meq/L (ref 96–112)
CO2: 25 meq/L (ref 19–32)
Calcium: 9.5 mg/dL (ref 8.4–10.5)
Creatinine, Ser: 1.25 mg/dL — ABNORMAL HIGH (ref 0.40–1.20)
GFR: 42.99 mL/min — AB (ref >60.00)
Glucose, Bld: 92 mg/dL (ref 70–99)
PHOSPHORUS: 4.1 mg/dL (ref 2.3–4.6)
POTASSIUM: 4.4 meq/L (ref 3.5–5.1)
SODIUM: 133 meq/L — AB (ref 135–145)

## 2015-12-17 LAB — T4, FREE: Free T4: 0.61 ng/dL (ref 0.60–1.60)

## 2015-12-17 LAB — SEDIMENTATION RATE: SED RATE: 117 mm/h — AB (ref 0–22)

## 2015-12-17 LAB — FOLATE: FOLATE: 7.1 ng/mL (ref >5.9)

## 2015-12-17 NOTE — Progress Notes (Signed)
BP 118/60 mmHg  Pulse 74  Temp(Src) 97.7 F (36.5 C) (Oral)  Ht 5' 1"  (1.549 m)  Wt 113 lb (51.256 kg)  BMI 21.36 kg/m2  SpO2 98%   CC: medicare wellness visit  Subjective:    Patient ID: Becky Adkins, female    DOB: 04-04-1925, 81 y.o.   MRN: [id number]  HPI: Becky Adkins is a 81 y.o. female presenting on 12/17/2015 for Medicare Wellness   PMR - long-term prednisone. Stopped earlier this year. Denies joint pains or headaches. ESR remains elevated.  Saw Dr Becky Adkins - stable severe CAD. Suggested iron panel today to further evaluate anemia.  Saw Dr Becky Adkins last month with fever. This has resolved.  ER 09/2015 with chest pain, found to have PNA. Completed treatment.  Noticing increased trouble with memory. No longer in memory unit, has moved to different higher LOC floor ([facility]). Some trouble remembering Becky Adkins.   Noted increased urination. Full incontinence - dependent on depens. denies dysuria or hematuria. No recent fevers. May not be drinking as much as she should.  40 lb weight loss in a year. Decreased appetite.   Failed vision screen today. Loses glasses. Failed hearing screen today. Known chronic R ear hearing loss.  2+ falls in last year with injury (goose egg to head). Regularly uses walker.  Denies depression/anhedonia. Doing well with celexa 11m daily, trazodone nightly.   Preventative: Colonoscopy - 2006 and normal per records/family. Aged out.  Mammogram - aged out. Will do breast exam today.  Flu shot yearly.  Tetanus - 2012.  pneuovax - 2012, prevnar 2015 zostavax - 2008  Advanced directives: does not have living will. "i've had a good life". Becky Adkins is HCPOA. Has signed DNR. Becky Adkins form filled 12/2012, ok for limited interventions, ok for transfer to ER but try to avoid ICU. Does not want compressions or intubation. Would consider short term IV fluid, abx and even feeding tube.  Seat belt use discussed.   Caffeine: 3  cups/day [place] [facility] [place], [place], [place]at [place] good diet, cardiac diet prescribed  Relevant past medical, surgical, family and social history reviewed and updated as indicated. Interim medical history since our last visit reviewed. Allergies and medications reviewed and updated. Current Outpatient Prescriptions on File Prior to Visit  Medication Sig  . acetaminophen (TYLENOL) 325 MG tablet Take 650 mg by mouth every 6 (six) hours as needed.  .Marland Kitchenalum & mag hydroxide-simeth (MAALOX/MYLANTA) 200-200-20 MG/5ML suspension Take 5 mLs by mouth every 6 (six) hours as needed for indigestion or heartburn.  .Marland Kitchenaspirin 81 MG chewable tablet Chew 81 mg by mouth daily.  .Marland Kitchenaspirin 81 MG EC tablet TAKE 1 TABLET BY MOUTH EACH DAY. ANTI PLATELET AGGREGATION  . atorvastatin (LIPITOR) 20 MG tablet Take 1 tablet (20 mg total) by mouth daily.  . benzonatate (TESSALON) 100 MG capsule Take 1 capsule (100 mg total) by mouth 3 (three) times daily as needed for cough.  . citalopram (CELEXA) 20 MG tablet TAKE 1 & 1/2 TABLETS BY MOUTH IN THE MORNING. (DEPRESSION)  . isosorbide mononitrate (IMDUR) 30 MG 24 hr tablet TAKE ONE TABLET BY MOUTH TWICE DAILY FOR HEART.  .Marland Kitchenlevothyroxine (SYNTHROID, LEVOTHROID) 100 MCG tablet Take 1 tablet (100 mcg total) by mouth daily before breakfast. On Wednesdays and Sundays only  . levothyroxine (SYNTHROID, LEVOTHROID) 50 MCG tablet Take one daily, but not on Wednesdays or Sundays  . loratadine (  CLARITIN) 5 MG chewable tablet 5 mg a day for 1 week, increase to 79m a day if needed thereafter for runny nose.  . Multiple Vitamins-Minerals (PRESERVISION AREDS) TABS TAKE ONE TABLET BY MOUTH ONCE DAILY IN THE AM FOR SUPPLEMENT  . nitroGLYCERIN (NITROSTAT) 0.4 MG SL tablet Place 0.4 mg under the tongue every 5 (five) minutes as needed.    . pantoprazole (PROTONIX) 40 MG tablet TAKE 1 TABLET BY MOUTH EACH MORNING. (GERD) **DO NOT  CRUSH**  . ranitidine (ZANTAC) 150 MG capsule Take 150 mg by mouth 2 (two) times daily.  . ranolazine (RANEXA) 500 MG 12 hr tablet Take 1 tablet (500 mg total) by mouth 2 (two) times daily.  . traZODone (DESYREL) 50 MG tablet TAKE 1/2 TABLET BY MOUTH EACH NIGHT AT BEDTIME. (SLEEP/DEPRESSION)   No current facility-administered medications on file prior to visit.    Review of Systems  Constitutional: Positive for appetite change and unexpected weight change (40 lb weight loss in last 1 year). Negative for fever, chills, activity change and fatigue.  HENT: Negative for hearing loss.   Eyes: Positive for visual disturbance (macular degeneration).  Respiratory: Negative for cough, chest tightness, shortness of breath and wheezing.   Cardiovascular: Negative for chest pain, palpitations and leg swelling.  Gastrointestinal: Negative for nausea, vomiting, abdominal pain, diarrhea, constipation, blood in stool and abdominal distention.  Genitourinary: Negative for hematuria and difficulty urinating.  Musculoskeletal: Negative for myalgias, arthralgias and neck pain.  Skin: Negative for rash.  Neurological: Negative for dizziness, seizures, syncope and headaches.  Hematological: Negative for adenopathy. Does not bruise/bleed easily.  Psychiatric/Behavioral: Negative for dysphoric mood. The patient is not nervous/anxious.    Per HPI unless specifically indicated in ROS section     Objective:    BP 118/60 mmHg  Pulse 74  Temp(Src) 97.7 F (36.5 C) (Oral)  Ht 5' 1"  (1.549 m)  Wt 113 lb (51.256 kg)  BMI 21.36 kg/m2  SpO2 98%  Wt Readings from Last 3 Encounters:  12/17/15 113 lb (51.256 kg)  11/17/15 112 lb (50.803 kg)  10/29/15 135 lb 6 oz (61.406 kg)    Physical Exam  Constitutional: She appears well-developed and well-nourished. No distress.  HENT:  Head: Normocephalic and atraumatic.  Right Ear: Hearing, tympanic membrane, external ear and ear canal normal.  Left Ear: Hearing,  tympanic membrane, external ear and ear canal normal.  Nose: Nose normal.  Mouth/Throat: Uvula is midline, oropharynx is clear and moist and mucous membranes are normal. No oropharyngeal exudate, posterior oropharyngeal edema or posterior oropharyngeal erythema.  Eyes: Conjunctivae and EOM are normal. Pupils are equal, round, and reactive to light. No scleral icterus.  Neck: Normal range of motion. Neck supple. Carotid bruit is not present. No thyromegaly present.  Cardiovascular: Normal rate, regular rhythm, normal heart sounds and intact distal pulses.   No murmur heard. Pulses:      Radial pulses are 2+ on the right side, and 2+ on the left side.  Pulmonary/Chest: Effort normal and breath sounds normal. No respiratory distress. She has no wheezes. She has no rales.  Abdominal: Soft. Bowel sounds are normal. She exhibits no distension and no mass. There is no tenderness. There is no rebound and no guarding.  Musculoskeletal: Normal range of motion. She exhibits no edema.  Lymphadenopathy:    She has no cervical adenopathy.  Neurological: She is alert.  Marked dementia, but pleasant and content CN grossly intact, station and gait intact  Skin: Skin is warm and dry.  No rash noted.  Psychiatric: She has a normal mood and affect. Her behavior is normal. Judgment and thought content normal.  Nursing note and vitals reviewed.  Results for orders placed or performed in visit on 12/17/15  Lipid panel  Result Value Ref Range   Cholesterol 256 (H) 0 - 200 mg/dL   Triglycerides 197.0 (H) 0.0 - 149.0 mg/dL   HDL 32.90 (L) >39.00 mg/dL   VLDL 39.4 0.0 - 40.0 mg/dL   LDL Cholesterol 183 (H) 0 - 99 mg/dL   Total CHOL/HDL Ratio 8    NonHDL 222.64   TSH  Result Value Ref Range   TSH 23.69 (H) 0.35 - 4.50 uIU/mL  CBC with Differential/Platelet  Result Value Ref Range   WBC 5.7 4.0 - 10.5 K/uL   RBC 2.83 (L) 3.87 - 5.11 Mil/uL   Hemoglobin 9.1 (L) 12.0 - 15.0 g/dL   HCT 27.5 (L) 36.0 - 46.0 %    MCV 97.3 78.0 - 100.0 fl   MCHC 33.2 30.0 - 36.0 g/dL   RDW 15.7 (H) 11.5 - 15.5 %   Platelets 320.0 150.0 - 400.0 K/uL   Neutrophils Relative % 47.3 43.0 - 77.0 %   Lymphocytes Relative 38.4 12.0 - 46.0 %   Monocytes Relative 11.0 3.0 - 12.0 %   Eosinophils Relative 2.7 0.0 - 5.0 %   Basophils Relative 0.6 0.0 - 3.0 %   Neutro Abs 2.7 1.4 - 7.7 K/uL   Lymphs Abs 2.2 0.7 - 4.0 K/uL   Monocytes Absolute 0.6 0.1 - 1.0 K/uL   Eosinophils Absolute 0.2 0.0 - 0.7 K/uL   Basophils Absolute 0.0 0.0 - 0.1 K/uL  T4, free  Result Value Ref Range   Free T4 0.61 0.60 - 1.60 ng/dL  Renal function panel  Result Value Ref Range   Sodium 133 (L) 135 - 145 mEq/L   Potassium 4.4 3.5 - 5.1 mEq/L   Chloride 100 96 - 112 mEq/L   CO2 25 19 - 32 mEq/L   Calcium 9.5 8.4 - 10.5 mg/dL   Albumin 3.4 (L) 3.5 - 5.2 g/dL   BUN 21 6 - 23 mg/dL   Creatinine, Ser 1.25 (H) 0.40 - 1.20 mg/dL   Glucose, Bld 92 70 - 99 mg/dL   Phosphorus 4.1 2.3 - 4.6 mg/dL   GFR 42.99 (L) >60.00 mL/min  Ferritin  Result Value Ref Range   Ferritin 16.1 10.0 - 291.0 ng/mL  IBC panel  Result Value Ref Range   Iron 41 (L) 42 - 145 ug/dL   Transferrin 219.0 212.0 - 360.0 mg/dL   Saturation Ratios 13.4 (L) 20.0 - 50.0 %  Folate  Result Value Ref Range   Folate 7.1 >5.9 ng/mL  Sedimentation rate  Result Value Ref Range   Sed Rate 117 (H) 0 - 22 mm/hr  POCT Urinalysis Dipstick  Result Value Ref Range   Color, UA Yellow    Clarity, UA Cloudy    Glucose, UA Neg    Bilirubin, UA Neg    Ketones, UA Neg    Spec Grav, UA 1.025    Blood, UA Neg    pH, UA 6.0    Protein, UA Neg    Urobilinogen, UA 0.2    Nitrite, UA Neg    Leukocytes, UA large (3+) (A) Negative      Assessment & Plan:   Problem List Items Addressed This Visit    Vitamin B12 deficiency    Off B12 supplement. Recheck today.  Polymyalgia rheumatica (HCC)    Prednisone has been stopped this year. Pt denies any arthralgias or headaches or vision  changes. Check ESR today.       Relevant Orders   Sedimentation rate (Completed)   Osteoporosis    Stable. Recent falls without fracture.       Medicare annual wellness visit, subsequent - Primary    I have personally reviewed the Medicare Annual Wellness questionnaire and have noted 1. The patient's medical and social history 2. Their use of alcohol, tobacco or illicit drugs 3. Their current medications and supplements 4. The patient's functional ability including ADL's, fall risks, home safety risks and hearing or visual impairment. Cognitive function has been assessed and addressed as indicated.  5. Diet and physical activity 6. Evidence for depression or mood disorders The patients weight, height, BMI have been recorded in the chart. I have made referrals, counseling and provided education to the patient based on review of the above and I have provided the pt with a written personalized care plan for preventive services. Provider list updated.. See scanned questionairre as needed for further documentation. Reviewed preventative protocols and updated unless pt declined.       MDD (major depressive disorder), recurrent episode, moderate (HCC)    Overall very stable period. Becky Adkins happy with current psychotropic regimen of trazodone nightly, celexa 1m daily.      Loss of weight    160lb 10/2013, 150lb 11/2014, down to nadir of 113lb today. Surprising 40 lb weight loss over the last year. Becky Adkins notes functional deterioration as well. Will further eval with labs including CBC, TFTs, renal panel and SPEP as well as iFOB. Given mild abd discomfort noted as well will also check abd [place] If unrevealing, ?progression of dementia. Discussed with Becky Adkins who agrees with plan.      Relevant Orders   [place]dom[place] Complete   Fecal occult blood, imunochemical   Hypothyroid    Chronic, check TFTs today.      Relevant Orders   TSH (Completed)   T4, free (Completed)   Hypertension    Chronic,  stable. Continue current regimen of isosorbide.      Hyperlipidemia with target LDL less than 70 (Chronic)    Check FLP today. On lipitor 244mdaily (restarted last month).      Relevant Orders   Lipid panel (Completed)   History of GI bleed    Continue aspirin 8168maily. Sent home with iFOB today.      Health maintenance examination    Preventative protocols reviewed and updated unless pt declined. Discussed healthy diet and lifestyle.       GERD (gastroesophageal reflux disease)    Continue PPI daily.      DNR (do not resuscitate)    Reviewed, still desired.      Dementia    Severe. Has moved in to [facility] level of care need noted with incontinence and decreased PO intake/apptite.      Coronary artery diseas severe multivessel with Left Main disease.. Plan medical management. (Chronic)    Continue medical management with statin, aspirin, isosorbide, ranexa. Limited options. Followed by cards.      Chronic kidney disease (CKD), stage III (moderate)    Chronic, stable. Recheck today.      Relevant Orders   CBC with Differential/Platelet (Completed)   Renal function panel (Completed)   Ferritin (Completed)   Serum protein electrophoresis with reflex   POCT Urinalysis Dipstick (Completed)   Anemia in  chronic kidney disease    Anticipate multifactorial anemia of CKD + IDA. Check iFOB today and anemia panel.  Will likely recommend restart iron.       Relevant Medications   cyanocobalamin 500 MCG tablet   Other Relevant Orders   US Abdomen Complete   Fecal occult blood, imunochemical   Ferritin (Completed)   IBC panel (Completed)   Folate (Completed)   Advanced care planning/counseling discussion    Advanced directives: does not have living will. "i've had a good life". Becky Adkins is HCPOA. Has signed DNR. Becky Adkins form filled 12/2012, ok for limited interventions, ok for transfer to ER but try to avoid ICU. Does not want compressions or intubation. Would  consider short term IV fluid, abx and even feeding tube.           Follow up plan: Return in about 3 months (around 03/16/2016), or as needed, for follow up visit.

## 2015-12-17 NOTE — Patient Instructions (Addendum)
labwork today. Urine check today. We will order abdominal ultrasound  Pass by lab to pick up stool kit. Return in 3-4 months for follow up.

## 2015-12-18 ENCOUNTER — Other Ambulatory Visit: Payer: Self-pay | Admitting: Family Medicine

## 2015-12-18 DIAGNOSIS — Z0000 Encounter for general adult medical examination without abnormal findings: Secondary | ICD-10-CM | POA: Insufficient documentation

## 2015-12-18 MED ORDER — LEVOTHYROXINE SODIUM 75 MCG PO TABS: 75.0000 ug | ORAL_TABLET | Freq: Every day | ORAL | Status: AC

## 2015-12-18 NOTE — Assessment & Plan Note (Addendum)
Continue medical management with statin, aspirin, isosorbide, ranexa. Limited options. Followed by cards.

## 2015-12-18 NOTE — Assessment & Plan Note (Signed)
Preventative protocols reviewed and updated unless pt declined. Discussed healthy diet and lifestyle.  

## 2015-12-18 NOTE — Assessment & Plan Note (Addendum)
160lb 10/2013, 150lb 11/2014, down to nadir of 113lb today. Surprising 40 lb weight loss over the last year. Son notes functional deterioration as well. Will further eval with labs including CBC, TFTs, renal panel and SPEP as well as iFOB. Given mild abd discomfort noted as well will also check abd [place]. If unrevealing, ?progression of dementia. Discussed with son who agrees with plan.

## 2015-12-18 NOTE — Assessment & Plan Note (Signed)

## 2015-12-18 NOTE — Assessment & Plan Note (Signed)
Anticipate multifactorial anemia of CKD + IDA. Check iFOB today and anemia panel.  Will likely recommend restart iron.

## 2015-12-18 NOTE — Assessment & Plan Note (Signed)
Advanced directives: does not have living will. "i've had a good life". Son is HCPOA. Has signed DNR. MOST form filled 12/2012, ok for limited interventions, ok for transfer to ER but try to avoid ICU. Does not want compressions or intubation. Would consider short term IV fluid, abx and even feeding tube.

## 2015-12-18 NOTE — Assessment & Plan Note (Signed)
Continue PPI daily. 

## 2015-12-18 NOTE — Assessment & Plan Note (Signed)
Chronic, stable. Recheck today.

## 2015-12-18 NOTE — Assessment & Plan Note (Signed)
Reviewed, still desired.

## 2015-12-18 NOTE — Assessment & Plan Note (Signed)
Continue aspirin 81mg  daily. Sent home with iFOB today.

## 2015-12-18 NOTE — Assessment & Plan Note (Signed)
Chronic, stable. Continue current regimen of isosorbide.

## 2015-12-18 NOTE — Assessment & Plan Note (Signed)
Off B12 supplement. Recheck today.

## 2015-12-18 NOTE — Assessment & Plan Note (Signed)
Prednisone has been stopped this year. Pt denies any arthralgias or headaches or vision changes. Check ESR today.

## 2015-12-18 NOTE — Assessment & Plan Note (Signed)
Chronic, check TFTs today.

## 2015-12-18 NOTE — Assessment & Plan Note (Addendum)
Check FLP today. On lipitor 20mg  daily (restarted last month).

## 2015-12-18 NOTE — Assessment & Plan Note (Addendum)
Severe. Has moved in to [facility] - higher level of care need noted with incontinence and decreased PO intake/apptite.

## 2015-12-18 NOTE — Assessment & Plan Note (Signed)
Overall very stable period. Son happy with current psychotropic regimen of trazodone nightly, celexa 30mg  daily.

## 2015-12-18 NOTE — Assessment & Plan Note (Signed)
Stable. Recent falls without fracture.

## 2015-12-19 ENCOUNTER — Telehealth: Payer: Self-pay

## 2015-12-19 ENCOUNTER — Other Ambulatory Visit: Payer: Self-pay | Admitting: Family Medicine

## 2015-12-19 DIAGNOSIS — E039 Hypothyroidism, unspecified: Secondary | ICD-10-CM

## 2015-12-19 LAB — PROTEIN ELECTROPHORESIS, SERUM, WITH REFLEX
ABNORMAL PROTEIN BAND2: 0.5 g/dL
ABNORMAL PROTEIN BAND3: NOT DETECTED g/dL
ALBUMIN ELP: 3.7 g/dL — AB (ref 3.8–4.8)
Abnormal Protein Band1: 0.7 g/dL
Alpha-1-Globulin: 0.3 g/dL (ref 0.2–0.3)
Alpha-2-Globulin: 0.9 g/dL (ref 0.5–0.9)
BETA 2: 0.4 g/dL (ref 0.2–0.5)
Beta Globulin: 1.5 g/dL — ABNORMAL HIGH (ref 0.4–0.6)
Gamma Globulin: 2.2 g/dL — ABNORMAL HIGH (ref 0.8–1.7)
TOTAL PROTEIN, SERUM ELECTROPHOR: 9 g/dL — AB (ref 6.1–8.1)

## 2015-12-19 LAB — IGG, IGA, IGM
IgA: 1320 mg/dL — ABNORMAL HIGH (ref 69–380)
IgG (Immunoglobin G), Serum: 2860 mg/dL — ABNORMAL HIGH (ref 690–1700)
IgM, Serum: 162 mg/dL (ref 52–322)

## 2015-12-19 LAB — IFE INTERPRETATION

## 2015-12-19 NOTE — Telephone Encounter (Signed)
D/c prior levothyroxine doses. New dose is 31mcg daily.

## 2015-12-19 NOTE — Addendum Note (Signed)
Addended by: Marchia Bond on: 12/19/2015 02:59 PM   Modules accepted: Orders

## 2015-12-19 NOTE — Telephone Encounter (Signed)
Becky Adkins notified of new dose.

## 2015-12-19 NOTE — Telephone Encounter (Signed)
Estill Bamberg with [facility] left v/m requesting cb to clarify instructions for levothyroxine 75 mcg daily. Pt had been taking levothyroxine 50 mcg on certain days and levothyroxine 100 mcg on other days. Estill Bamberg wants to know if pt is only taking 75 mcg daily and whether to d/c the 50 mcg and 100 mcg dosages.

## 2015-12-20 LAB — URINE CULTURE: Colony Count: >=100000

## 2015-12-30 ENCOUNTER — Other Ambulatory Visit: Payer: Self-pay | Admitting: Family Medicine

## 2015-12-30 ENCOUNTER — Encounter: Payer: Self-pay | Admitting: Family Medicine

## 2015-12-30 DIAGNOSIS — R634 Abnormal weight loss: Secondary | ICD-10-CM

## 2015-12-30 DIAGNOSIS — D472 Monoclonal gammopathy: Secondary | ICD-10-CM | POA: Insufficient documentation

## 2015-12-31 ENCOUNTER — Encounter: Payer: Self-pay | Admitting: Emergency Medicine

## 2015-12-31 ENCOUNTER — Emergency Department
Admission: EM | Admit: 2015-12-31 | Discharge: 2015-12-31 | Disposition: A | Discharge status: Home / Self Care | Payer: Medicare Other | Attending: Emergency Medicine | Admitting: Emergency Medicine

## 2015-12-31 ENCOUNTER — Emergency Department: Payer: Medicare Other

## 2015-12-31 DIAGNOSIS — R0600 Dyspnea, unspecified: Secondary | ICD-10-CM

## 2015-12-31 DIAGNOSIS — Z880 Allergy status to penicillin: Secondary | ICD-10-CM | POA: Diagnosis not present

## 2015-12-31 DIAGNOSIS — R410 Disorientation, unspecified: Secondary | ICD-10-CM | POA: Insufficient documentation

## 2015-12-31 DIAGNOSIS — F0390 Unspecified dementia without behavioral disturbance: Secondary | ICD-10-CM | POA: Diagnosis not present

## 2015-12-31 DIAGNOSIS — N183 Chronic kidney disease, stage 3 (moderate): Secondary | ICD-10-CM | POA: Insufficient documentation

## 2015-12-31 DIAGNOSIS — I129 Hypertensive chronic kidney disease with stage 1 through stage 4 chronic kidney disease, or unspecified chronic kidney disease: Secondary | ICD-10-CM | POA: Insufficient documentation

## 2015-12-31 DIAGNOSIS — R0989 Other specified symptoms and signs involving the circulatory and respiratory systems: Secondary | ICD-10-CM | POA: Diagnosis not present

## 2015-12-31 DIAGNOSIS — R0602 Shortness of breath: Secondary | ICD-10-CM | POA: Diagnosis present

## 2015-12-31 DIAGNOSIS — Z79899 Other long term (current) drug therapy: Secondary | ICD-10-CM | POA: Insufficient documentation

## 2015-12-31 DIAGNOSIS — Z7982 Long term (current) use of aspirin: Secondary | ICD-10-CM | POA: Diagnosis not present

## 2015-12-31 LAB — COMPREHENSIVE METABOLIC PANEL
ALT: 7 U/L — AB (ref 14–54)
AST: 18 U/L (ref 15–41)
Albumin: 3.2 g/dL — ABNORMAL LOW (ref 3.5–5.0)
Alkaline Phosphatase: 50 U/L (ref 38–126)
Anion gap: 8 (ref 5–15)
BUN: 24 mg/dL — AB (ref 6–20)
CALCIUM: 9.1 mg/dL (ref 8.9–10.3)
CO2: 23 mmol/L (ref 22–32)
Chloride: 104 mmol/L (ref 101–111)
Creatinine, Ser: 1.32 mg/dL — ABNORMAL HIGH (ref 0.44–1.00)
GFR calc non Af Amer: 35 mL/min — ABNORMAL LOW (ref >60)
GFR, EST AFRICAN AMERICAN: 40 mL/min — AB (ref >60)
GLUCOSE: 118 mg/dL — AB (ref 65–99)
Potassium: 4 mmol/L (ref 3.5–5.1)
SODIUM: 135 mmol/L (ref 135–145)
TOTAL PROTEIN: 7.9 g/dL (ref 6.5–8.1)
Total Bilirubin: 0.5 mg/dL (ref 0.3–1.2)

## 2015-12-31 LAB — CBC
HCT: 24.8 % — ABNORMAL LOW (ref 35.0–47.0)
Hemoglobin: 8.3 g/dL — ABNORMAL LOW (ref 12.0–16.0)
MCH: 31.7 pg (ref 26.0–34.0)
MCHC: 33.4 g/dL (ref 32.0–36.0)
MCV: 95 fL (ref 80.0–100.0)
PLATELETS: 231 10*3/uL (ref 150–440)
RBC: 2.61 MIL/uL — AB (ref 3.80–5.20)
RDW: 15.1 % — AB (ref 11.5–14.5)
WBC: 4.6 10*3/uL (ref 3.6–11.0)

## 2015-12-31 LAB — TROPONIN I: Troponin I: 0.03 ng/mL (ref <0.031)

## 2015-12-31 NOTE — ED Notes (Signed)
Patient brought in by [facility] from [facility], per staff patient choked on her morning medication, turned blue, and fell to the floor. EMS said wen they arrived patient was alert and talking, sats were in the 80's. Patient was placed on 2L of O2 and sats improved. Patient told EMS she has had cough and congestion for the past day.

## 2015-12-31 NOTE — ED Notes (Signed)
Spoke with [place] at [facility], informed her that patients x-ray was ok and all labs came back ok. Informed her that we would be sending patient back. Jacklyn verbalized understanding. They have someone who can come pick her up.

## 2015-12-31 NOTE — Discharge Instructions (Signed)

## 2015-12-31 NOTE — ED Provider Notes (Signed)
[facility] Emergency Department Provider Note  Time seen: 8:49 AM  I have reviewed the triage vital signs and the nursing notes.   HISTORY  Chief Complaint Shortness of Breath    HPI Becky Adkins is a 82 y.o. female with a past medical history of dementia, hypertension, hyperlipidemia, polymyalgia rheumatica, anemia, gastric reflux, presents to the emergency department after a choking episode. According to EMS report the patient was taking medication and began choking, turning a blue color and felt the floor. By the time EMS arrived the patient was awake and alert, however oxygen saturation was around 80% on room air. Patient was placed on oxygen and this improved to the low 90s. Nursing home staff and also noted that the patient has been coughing for several days. Patient has a history of dementia and cannot provide an accurate history.     Past Medical History  Diagnosis Date  . Hypertension   . Hyperlipidemia   . Polymyalgia rheumatica (HCC)     on prednisone, has seen neuroophthalmologist, had normal temporal artery biopsy at dx  . History of GI bleed 12/2008    w/ submucosal gastric ulcers  . Dementia     mild, significant short term memory loss, eval by memory clinic and didn't think needed further meds  . Hiatal hernia   . Personal history of TIA (transient ischemic attack) 2009    neurology w/u with normal CT head, echo, carotid US  . Anemia     s/p hematology workup, h/o B12 shots  . Macular degeneration     and cataracts  . Deafness     right ear (mumps)  . GERD (gastroesophageal reflux disease)     severe on EGD with HH, H pylori neg  . Hypothyroid     s/p thyroidectomy  . Osteoporosis     was on boniva  . 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 Migraines     none recently  . Arthritis   . Depression   . Urinary incontinence   . CAD (coronary artery disease)     cath 12/11 at [facility]:  severe 3-v CAD including LM 80% LAD 70% LCX 90% RCA 50-60%, decided medical treatment  . DNR (do not resuscitate) 12/2012    MOST form filled 12/2012, ok for limited interventions, ok for transfer to ER but try to avoid ICU  . IgA monoclonal gammopathy 2016    IgA kappa gammopathy  . Chronic kidney disease (CKD), stage III (moderate) 08/13/2011    Since 2011.  No Mspike on SPEP 2013     Patient Active Problem List   Diagnosis Date Noted  . IgA monoclonal gammopathy   . Health maintenance examination 12/18/2015  . Loss of weight 12/17/2015  . Hyperlipidemia with target LDL less than 70 09/19/2015  . Orthostatic hypotension 02/19/2015  . Advanced care planning/counseling discussion 12/09/2014  . Angina, class III ([place]) 10/11/2013  . Vitamin B12 deficiency 07/30/2013  . DNR (do not resuscitate)   . Deafness   . Medicare annual wellness visit, subsequent 08/16/2012  . Anemia in chronic kidney disease 08/15/2011  . Chronic kidney disease (CKD), stage III (moderate) 08/13/2011  . Hypertension   . Polymyalgia rheumatica ([place])   . Dementia   . Personal history of TIA (transient ischemic attack)   . Macular degeneration   . GERD (gastroesophageal reflux disease)   . Hypothyroid   . Osteoporosis   . MDD (major  depressive disorder), recurrent episode, moderate ([place])   . Coronary artery diseas severe multivessel with Left Main disease.. Plan medical management. 03/23/2011  . History of GI bleed 12/27/2008    Past Surgical History  Procedure Laterality Date  . Thyroidectomy      for benign tumor  . Appendectomy    . Dilation and curettage of uterus    . Hospitalization  2007    global amnesia  . Hospitalization  12/2008    bleeding ulcer, asp PNA  . Hospitalization  2011    CP, r/o x 2 (neg cardiolite and ETT)  . Esophagogastroduodenoscopy  07/2009    LA grade C reflux esophagitis, HH, single gastric nodule ([place])  . Cholecystectomy    . Colonoscopy  2006    normal per  previous records    Current Outpatient Rx  Name  Route  Sig  Dispense  Refill  . acetaminophen (TYLENOL) 325 MG tablet   Oral   Take 650 mg by mouth every 6 (six) hours as needed.         Marland Kitchen alum & mag hydroxide-simeth (MAALOX/MYLANTA) 200-200-20 MG/5ML suspension   Oral   Take 5 mLs by mouth every 6 (six) hours as needed for indigestion or heartburn.         Marland Kitchen aspirin 81 MG chewable tablet   Oral   Chew 81 mg by mouth daily.         Marland Kitchen atorvastatin (LIPITOR) 20 MG tablet   Oral   Take 1 tablet (20 mg total) by mouth daily.   90 tablet   3   . benzonatate (TESSALON) 100 MG capsule   Oral   Take 1 capsule (100 mg total) by mouth 3 (three) times daily as needed for cough.   30 capsule   1   . citalopram (CELEXA) 20 MG tablet      TAKE 1 & 1/2 TABLETS BY MOUTH IN THE MORNING. (DEPRESSION)   45 tablet   6   . cyanocobalamin 500 MCG tablet   Oral   Take 500 mcg by mouth daily.         . isosorbide mononitrate (IMDUR) 30 MG 24 hr tablet      TAKE ONE TABLET BY MOUTH TWICE DAILY FOR HEART.   60 tablet   6   . levothyroxine (SYNTHROID, LEVOTHROID) 75 MCG tablet   Oral   Take 1 tablet (75 mcg total) by mouth daily before breakfast.   30 tablet   11     Cancel other levothyroxine Rx   . loratadine (CLARITIN) 5 MG chewable tablet      5 mg a day for 1 week, increase to 10mg  a day if needed thereafter for runny nose.   60 tablet   12   . Multiple Vitamins-Minerals (PRESERVISION AREDS) TABS      TAKE ONE TABLET BY MOUTH ONCE DAILY IN THE AM FOR SUPPLEMENT   30 tablet   6   . nitroGLYCERIN (NITROSTAT) 0.4 MG SL tablet   Sublingual   Place 0.4 mg under the tongue every 5 (five) minutes as needed.           . pantoprazole (PROTONIX) 40 MG tablet      TAKE 1 TABLET BY MOUTH EACH MORNING. (GERD) **DO NOT CRUSH**   30 tablet   11   . ranitidine (ZANTAC) 150 MG capsule   Oral   Take 150 mg by mouth 2 (two) times daily.         Marland Kitchen  ranolazine (RANEXA)  500 MG 12 hr tablet   Oral   Take 1 tablet (500 mg total) by mouth 2 (two) times daily.   60 tablet   6   . traZODone (DESYREL) 50 MG tablet      TAKE 1/2 TABLET BY MOUTH EACH NIGHT AT BEDTIME. (SLEEP/DEPRESSION)   15 tablet   11     Allergies Aricept; Codeine; and Penicillins  Family History  Problem Relation Age of Onset  . Cancer Mother     cervical  . Coronary artery disease Father   . Alcohol abuse Father   . Diabetes Paternal Aunt     Social History Social History  Substance Use Topics  . Smoking status: Never Smoker   . Smokeless tobacco: Never Used  . Alcohol Use: No    Review of Systems Unable to obtain an accurate review of systems due to history of dementia  ____________________________________________   PHYSICAL EXAM:  VITAL SIGNS: ED Triage Vitals  Enc Vitals Group     BP 12/31/15 0844 105/67 mmHg     Pulse Rate 12/31/15 0842 75     Resp 12/31/15 0842 15     Temp 12/31/15 0842 97.4 F (36.3 C)     Temp Source 12/31/15 0842 Oral     SpO2 12/31/15 0842 96 %     Weight 12/31/15 0842 120 lb (54.432 kg)     Height 12/31/15 0842 5\' 1"  (1.549 m)     Head Cir --      Peak Flow --      Pain Score 12/31/15 0843 0     Pain Loc --      Pain Edu? --      Excl. in [place]? --     Constitutional: Alert, disoriented which is likely baseline. No acute distress. No respiratory distress. Eyes: Normal exam ENT   Head: Normocephalic and atraumatic.   Mouth/Throat: Mucous membranes are moist. Cardiovascular: Normal rate, regular rhythm. No murmur Respiratory: Moderate rhonchi auscultated bilaterally. No wheeze. No respiratory distress. Gastrointestinal: Soft and nontender. No distention.   Musculoskeletal: Nontender with normal range of motion in all extremities. Neurologic:  Normal speech and language. No gross focal neurologic deficits Skin:  Skin is warm, dry and intact.  Psychiatric: Mood and affect are normal. Speech and behavior are normal.    ____________________________________________    EKG  EKG reviewed and interpreted by myself shows normal sinus rhythm at 72 bpm, narrow QRS, normal axis, normal intervals besides a prolonged QTC of 598, nonspecific ST changes.  ____________________________________________    RADIOLOGY  Chest x-ray shows no acute abnormality  ____________________________________________   INITIAL IMPRESSION / ASSESSMENT AND PLAN / ED COURSE  Pertinent labs & imaging results that were available during my care of the patient were reviewed by me and considered in my medical decision making (see chart for details).  Patient presents the emergency department after a reported choking episode turning blue and falling to the floor. EMS states initial oxygen saturation of 80% on room air. Currently the patient is 90-93 percent on nasal cannula oxygen. Patient has diffuse rhonchi however no tachypnea noted. No distress. We will check labs, chest x-ray, and closely monitor in the emergency department.  Patient now maintaining an oxygen saturation of 95-97 percent on room air after warming her hands. Patient denies any complaints, work up is been negative, lungs were much more clear at this point. We'll discharge back to her nursing facility with primary care follow-up in 1-2 days.  ____________________________________________   FINAL CLINICAL IMPRESSION(S) / ED DIAGNOSES  Dyspnea Choking   Harvest Dark, MD 12/31/15 1345

## 2015-12-31 NOTE — ED Notes (Signed)
Called and spoke with Becky Adkins to see if they would be able to transport patient back to their facility. They are going to check with the supervisor and call us back.

## 2015-12-31 NOTE — ED Notes (Addendum)
Patient gave verbal ok to speak with daughter in law, Diane.  Returned call to Diane to update her on patient condition, informed her that patient was brought in from [facility] for choking on medication and that when EMS arrived patients O2 sats were low. Diane stated that patient does not usually wear O2 at home. Asked Diane about patients normal metnal status, per Diane, patient does have a hx/o Dementia and that patient is disoriented x 4 at baseline. Diane also reports that patient has hallucinations at time. RN Informed Diane that we are still waiting on more test results to come back and that once those results are back we will know if patient will be able to return to [facility] or if she will need to be admitted. Diane request call back if patient is admitted.   Diane contact number [phone]

## 2016-01-05 ENCOUNTER — Telehealth: Payer: Self-pay

## 2016-01-05 DIAGNOSIS — I25118 Atherosclerotic heart disease of native coronary artery with other forms of angina pectoris: Secondary | ICD-10-CM

## 2016-01-05 DIAGNOSIS — R634 Abnormal weight loss: Secondary | ICD-10-CM

## 2016-01-05 DIAGNOSIS — Z66 Do not resuscitate: Secondary | ICD-10-CM

## 2016-01-05 DIAGNOSIS — F0390 Unspecified dementia without behavioral disturbance: Secondary | ICD-10-CM

## 2016-01-05 DIAGNOSIS — N183 Chronic kidney disease, stage 3 unspecified: Secondary | ICD-10-CM

## 2016-01-05 DIAGNOSIS — I209 Angina pectoris, unspecified: Secondary | ICD-10-CM

## 2016-01-05 DIAGNOSIS — D472 Monoclonal gammopathy: Secondary | ICD-10-CM

## 2016-01-05 NOTE — Telephone Encounter (Signed)
Becky Adkins (DPR signed)left v/m requesting referral to hospice of [facility]; pt last annual exam on 12/17/15. Pt is at the [facility]. Becky Adkins said pts eating has declined; less responsive and choking more; needs more assistance with walking. Becky Adkins request cb.

## 2016-01-05 NOTE — Telephone Encounter (Signed)
Spoke with daughter in Sports coach. Progressive decline. Hospice referral is appropriate. Placed.

## 2016-01-06 IMAGING — CR DG CHEST 1V PORT
1 series · 1 of 1 positions shown · non-contrast
Comparison: 12/08/2010

CLINICAL DATA: Initial evaluation cough for 1 week with shortness
of breath chest tightness

EXAM:
PORTABLE CHEST - 1 VIEW

[ap]
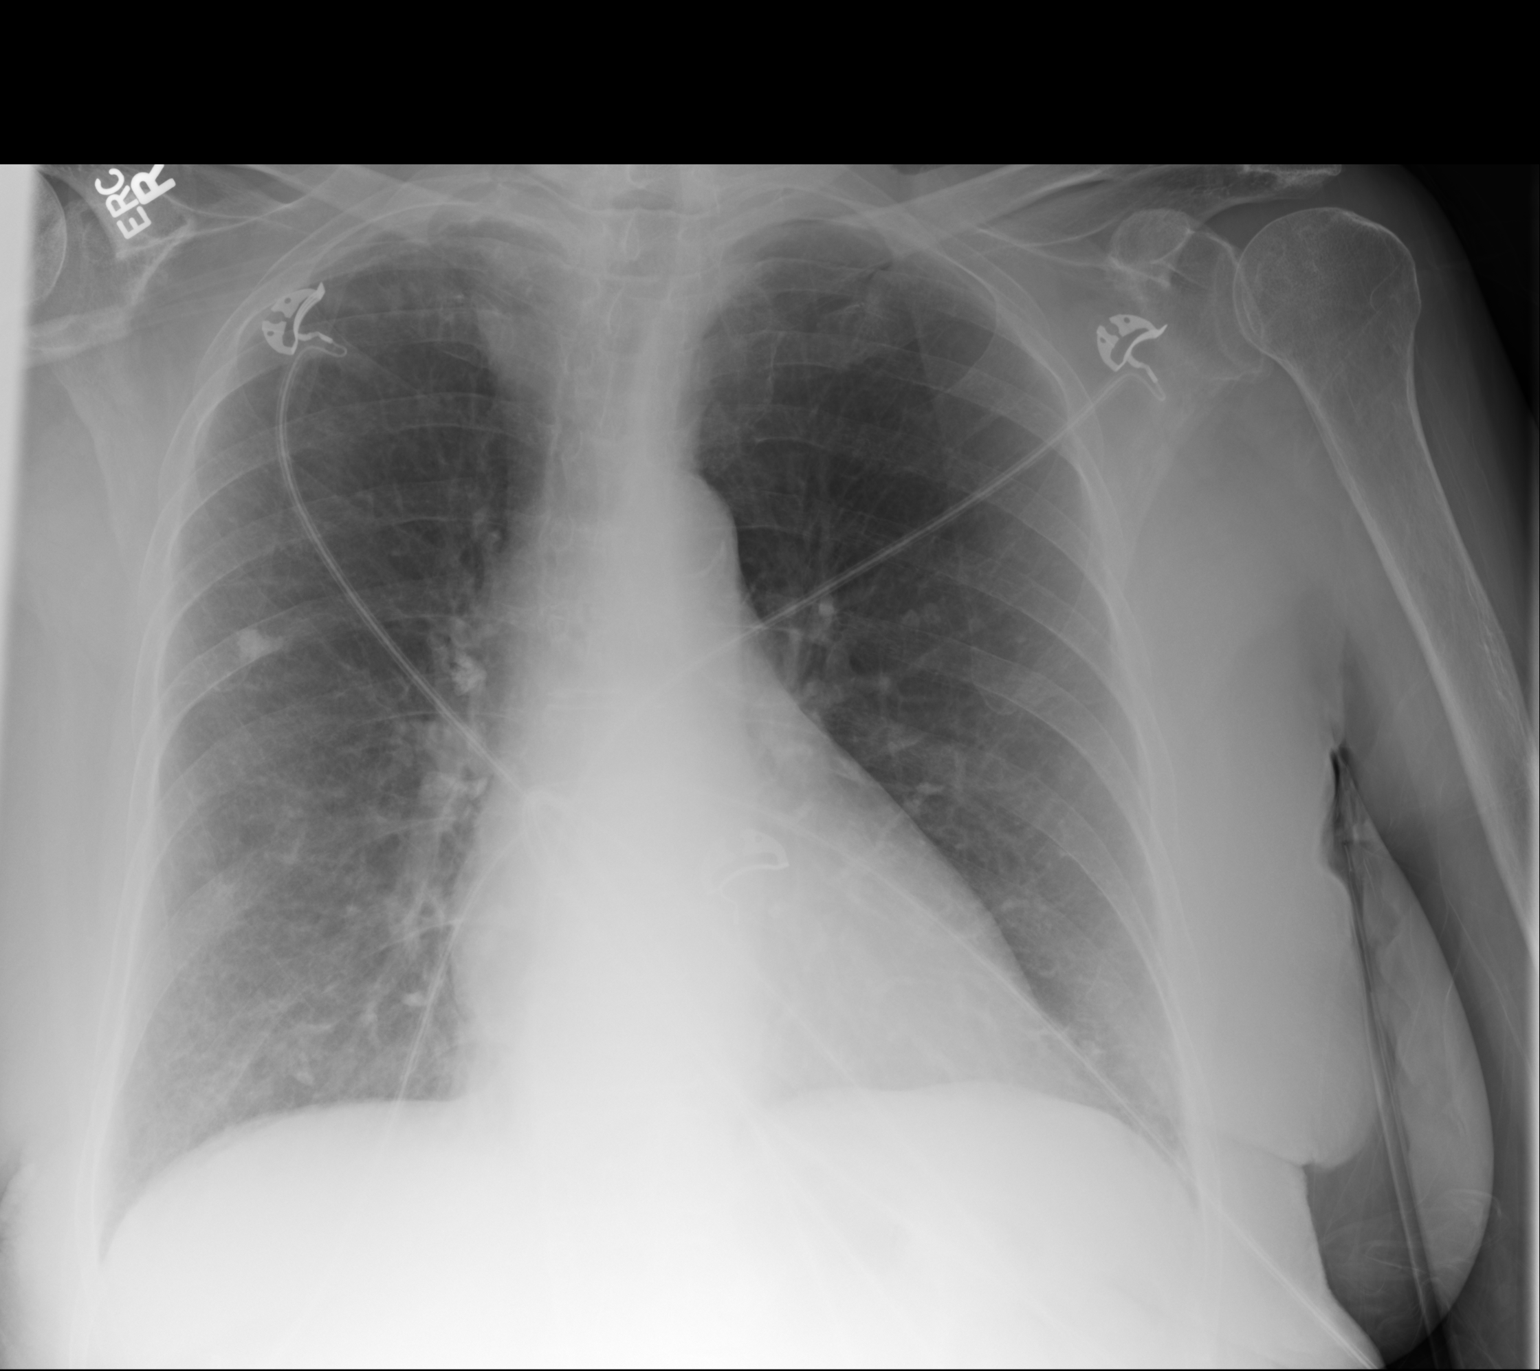

[1 of 1 positions shown; findings below may reference images not displayed]

FINDINGS: Stable mild cardiac enlargement. Vascular pattern normal. Left lung
clear. Calcified nodule right middle lobe measures 7 mm, increased
from 6 mm previously. Right lung otherwise clear. A few small right
hilar calcified lymph nodes are stable.
IMPRESSION: No acute findings.  Prior granulomatous disease on the right.

## 2016-01-07 NOTE — Telephone Encounter (Signed)
Hospice Referral faxed to [facility] . Left message for Diane Rita that the Referral has been faxed.

## 2016-01-08 ENCOUNTER — Ambulatory Visit
Admission: RE | Admit: 2016-01-08 | Discharge: 2016-01-08 | Disposition: A | Discharge status: Home / Self Care | Payer: Medicare Other | Source: Ambulatory Visit | Attending: Family Medicine | Admitting: Family Medicine

## 2016-01-08 ENCOUNTER — Ambulatory Visit: Payer: Medicare Other

## 2016-01-08 DIAGNOSIS — F331 Major depressive disorder, recurrent, moderate: Secondary | ICD-10-CM | POA: Diagnosis not present

## 2016-01-08 DIAGNOSIS — M353 Polymyalgia rheumatica: Secondary | ICD-10-CM | POA: Diagnosis not present

## 2016-01-08 DIAGNOSIS — I129 Hypertensive chronic kidney disease with stage 1 through stage 4 chronic kidney disease, or unspecified chronic kidney disease: Secondary | ICD-10-CM | POA: Diagnosis not present

## 2016-01-08 DIAGNOSIS — D472 Monoclonal gammopathy: Secondary | ICD-10-CM | POA: Diagnosis not present

## 2016-01-08 DIAGNOSIS — F0390 Unspecified dementia without behavioral disturbance: Secondary | ICD-10-CM | POA: Diagnosis not present

## 2016-01-08 DIAGNOSIS — N200 Calculus of kidney: Secondary | ICD-10-CM | POA: Insufficient documentation

## 2016-01-08 DIAGNOSIS — N183 Chronic kidney disease, stage 3 (moderate): Secondary | ICD-10-CM | POA: Diagnosis not present

## 2016-01-08 DIAGNOSIS — E039 Hypothyroidism, unspecified: Secondary | ICD-10-CM | POA: Diagnosis not present

## 2016-01-08 DIAGNOSIS — I209 Angina pectoris, unspecified: Secondary | ICD-10-CM | POA: Diagnosis not present

## 2016-01-08 DIAGNOSIS — Z8719 Personal history of other diseases of the digestive system: Secondary | ICD-10-CM | POA: Diagnosis not present

## 2016-01-08 DIAGNOSIS — R634 Abnormal weight loss: Secondary | ICD-10-CM | POA: Diagnosis not present

## 2016-01-08 DIAGNOSIS — I25118 Atherosclerotic heart disease of native coronary artery with other forms of angina pectoris: Secondary | ICD-10-CM | POA: Diagnosis not present

## 2016-01-08 DIAGNOSIS — N281 Cyst of kidney, acquired: Secondary | ICD-10-CM | POA: Diagnosis not present

## 2016-01-08 DIAGNOSIS — E785 Hyperlipidemia, unspecified: Secondary | ICD-10-CM | POA: Diagnosis not present

## 2016-01-08 DIAGNOSIS — M810 Age-related osteoporosis without current pathological fracture: Secondary | ICD-10-CM | POA: Diagnosis not present

## 2016-01-08 DIAGNOSIS — K219 Gastro-esophageal reflux disease without esophagitis: Secondary | ICD-10-CM | POA: Diagnosis not present

## 2016-01-08 DIAGNOSIS — D631 Anemia in chronic kidney disease: Secondary | ICD-10-CM

## 2016-01-08 DIAGNOSIS — D538 Other specified nutritional anemias: Secondary | ICD-10-CM | POA: Diagnosis not present

## 2016-01-08 DIAGNOSIS — N189 Chronic kidney disease, unspecified: Secondary | ICD-10-CM

## 2016-01-09 DIAGNOSIS — D631 Anemia in chronic kidney disease: Secondary | ICD-10-CM | POA: Diagnosis not present

## 2016-01-09 DIAGNOSIS — I25118 Atherosclerotic heart disease of native coronary artery with other forms of angina pectoris: Secondary | ICD-10-CM | POA: Diagnosis not present

## 2016-01-09 DIAGNOSIS — R634 Abnormal weight loss: Secondary | ICD-10-CM | POA: Diagnosis not present

## 2016-01-09 DIAGNOSIS — N183 Chronic kidney disease, stage 3 (moderate): Secondary | ICD-10-CM | POA: Diagnosis not present

## 2016-01-09 DIAGNOSIS — I129 Hypertensive chronic kidney disease with stage 1 through stage 4 chronic kidney disease, or unspecified chronic kidney disease: Secondary | ICD-10-CM | POA: Diagnosis not present

## 2016-01-09 DIAGNOSIS — I209 Angina pectoris, unspecified: Secondary | ICD-10-CM | POA: Diagnosis not present

## 2016-01-12 DIAGNOSIS — R634 Abnormal weight loss: Secondary | ICD-10-CM | POA: Diagnosis not present

## 2016-01-12 DIAGNOSIS — D631 Anemia in chronic kidney disease: Secondary | ICD-10-CM | POA: Diagnosis not present

## 2016-01-12 DIAGNOSIS — I25118 Atherosclerotic heart disease of native coronary artery with other forms of angina pectoris: Secondary | ICD-10-CM | POA: Diagnosis not present

## 2016-01-12 DIAGNOSIS — N183 Chronic kidney disease, stage 3 (moderate): Secondary | ICD-10-CM | POA: Diagnosis not present

## 2016-01-12 DIAGNOSIS — I129 Hypertensive chronic kidney disease with stage 1 through stage 4 chronic kidney disease, or unspecified chronic kidney disease: Secondary | ICD-10-CM | POA: Diagnosis not present

## 2016-01-12 DIAGNOSIS — I209 Angina pectoris, unspecified: Secondary | ICD-10-CM | POA: Diagnosis not present

## 2016-01-13 DIAGNOSIS — D631 Anemia in chronic kidney disease: Secondary | ICD-10-CM | POA: Diagnosis not present

## 2016-01-13 DIAGNOSIS — I209 Angina pectoris, unspecified: Secondary | ICD-10-CM | POA: Diagnosis not present

## 2016-01-13 DIAGNOSIS — R634 Abnormal weight loss: Secondary | ICD-10-CM | POA: Diagnosis not present

## 2016-01-13 DIAGNOSIS — I25118 Atherosclerotic heart disease of native coronary artery with other forms of angina pectoris: Secondary | ICD-10-CM | POA: Diagnosis not present

## 2016-01-13 DIAGNOSIS — I129 Hypertensive chronic kidney disease with stage 1 through stage 4 chronic kidney disease, or unspecified chronic kidney disease: Secondary | ICD-10-CM | POA: Diagnosis not present

## 2016-01-13 DIAGNOSIS — N183 Chronic kidney disease, stage 3 (moderate): Secondary | ICD-10-CM | POA: Diagnosis not present

## 2016-01-14 DIAGNOSIS — R634 Abnormal weight loss: Secondary | ICD-10-CM | POA: Diagnosis not present

## 2016-01-14 DIAGNOSIS — I129 Hypertensive chronic kidney disease with stage 1 through stage 4 chronic kidney disease, or unspecified chronic kidney disease: Secondary | ICD-10-CM | POA: Diagnosis not present

## 2016-01-14 DIAGNOSIS — D631 Anemia in chronic kidney disease: Secondary | ICD-10-CM | POA: Diagnosis not present

## 2016-01-14 DIAGNOSIS — I25118 Atherosclerotic heart disease of native coronary artery with other forms of angina pectoris: Secondary | ICD-10-CM | POA: Diagnosis not present

## 2016-01-14 DIAGNOSIS — I209 Angina pectoris, unspecified: Secondary | ICD-10-CM | POA: Diagnosis not present

## 2016-01-14 DIAGNOSIS — N183 Chronic kidney disease, stage 3 (moderate): Secondary | ICD-10-CM | POA: Diagnosis not present

## 2016-01-15 ENCOUNTER — Other Ambulatory Visit: Payer: Medicare Other

## 2016-01-15 DIAGNOSIS — I209 Angina pectoris, unspecified: Secondary | ICD-10-CM | POA: Diagnosis not present

## 2016-01-15 DIAGNOSIS — D631 Anemia in chronic kidney disease: Secondary | ICD-10-CM

## 2016-01-15 DIAGNOSIS — R634 Abnormal weight loss: Secondary | ICD-10-CM

## 2016-01-15 DIAGNOSIS — N183 Chronic kidney disease, stage 3 (moderate): Secondary | ICD-10-CM | POA: Diagnosis not present

## 2016-01-15 DIAGNOSIS — I25118 Atherosclerotic heart disease of native coronary artery with other forms of angina pectoris: Secondary | ICD-10-CM | POA: Diagnosis not present

## 2016-01-15 DIAGNOSIS — I129 Hypertensive chronic kidney disease with stage 1 through stage 4 chronic kidney disease, or unspecified chronic kidney disease: Secondary | ICD-10-CM | POA: Diagnosis not present

## 2016-01-15 DIAGNOSIS — N189 Chronic kidney disease, unspecified: Secondary | ICD-10-CM

## 2016-01-15 LAB — FECAL OCCULT BLOOD, IMMUNOCHEMICAL: Fecal Occult Bld: NEGATIVE

## 2016-01-16 DIAGNOSIS — I209 Angina pectoris, unspecified: Secondary | ICD-10-CM | POA: Diagnosis not present

## 2016-01-16 DIAGNOSIS — D631 Anemia in chronic kidney disease: Secondary | ICD-10-CM | POA: Diagnosis not present

## 2016-01-16 DIAGNOSIS — I129 Hypertensive chronic kidney disease with stage 1 through stage 4 chronic kidney disease, or unspecified chronic kidney disease: Secondary | ICD-10-CM | POA: Diagnosis not present

## 2016-01-16 DIAGNOSIS — N183 Chronic kidney disease, stage 3 (moderate): Secondary | ICD-10-CM | POA: Diagnosis not present

## 2016-01-16 DIAGNOSIS — R634 Abnormal weight loss: Secondary | ICD-10-CM | POA: Diagnosis not present

## 2016-01-16 DIAGNOSIS — I25118 Atherosclerotic heart disease of native coronary artery with other forms of angina pectoris: Secondary | ICD-10-CM | POA: Diagnosis not present

## 2016-01-19 DIAGNOSIS — I25118 Atherosclerotic heart disease of native coronary artery with other forms of angina pectoris: Secondary | ICD-10-CM | POA: Diagnosis not present

## 2016-01-19 DIAGNOSIS — I209 Angina pectoris, unspecified: Secondary | ICD-10-CM | POA: Diagnosis not present

## 2016-01-19 DIAGNOSIS — I129 Hypertensive chronic kidney disease with stage 1 through stage 4 chronic kidney disease, or unspecified chronic kidney disease: Secondary | ICD-10-CM | POA: Diagnosis not present

## 2016-01-19 DIAGNOSIS — D631 Anemia in chronic kidney disease: Secondary | ICD-10-CM | POA: Diagnosis not present

## 2016-01-19 DIAGNOSIS — R634 Abnormal weight loss: Secondary | ICD-10-CM | POA: Diagnosis not present

## 2016-01-19 DIAGNOSIS — N183 Chronic kidney disease, stage 3 (moderate): Secondary | ICD-10-CM | POA: Diagnosis not present

## 2016-01-20 DIAGNOSIS — I129 Hypertensive chronic kidney disease with stage 1 through stage 4 chronic kidney disease, or unspecified chronic kidney disease: Secondary | ICD-10-CM | POA: Diagnosis not present

## 2016-01-20 DIAGNOSIS — I209 Angina pectoris, unspecified: Secondary | ICD-10-CM | POA: Diagnosis not present

## 2016-01-20 DIAGNOSIS — N183 Chronic kidney disease, stage 3 (moderate): Secondary | ICD-10-CM | POA: Diagnosis not present

## 2016-01-20 DIAGNOSIS — D631 Anemia in chronic kidney disease: Secondary | ICD-10-CM | POA: Diagnosis not present

## 2016-01-20 DIAGNOSIS — I25118 Atherosclerotic heart disease of native coronary artery with other forms of angina pectoris: Secondary | ICD-10-CM | POA: Diagnosis not present

## 2016-01-20 DIAGNOSIS — R634 Abnormal weight loss: Secondary | ICD-10-CM | POA: Diagnosis not present

## 2016-01-21 DIAGNOSIS — D631 Anemia in chronic kidney disease: Secondary | ICD-10-CM | POA: Diagnosis not present

## 2016-01-21 DIAGNOSIS — I209 Angina pectoris, unspecified: Secondary | ICD-10-CM | POA: Diagnosis not present

## 2016-01-21 DIAGNOSIS — N183 Chronic kidney disease, stage 3 (moderate): Secondary | ICD-10-CM | POA: Diagnosis not present

## 2016-01-21 DIAGNOSIS — I129 Hypertensive chronic kidney disease with stage 1 through stage 4 chronic kidney disease, or unspecified chronic kidney disease: Secondary | ICD-10-CM | POA: Diagnosis not present

## 2016-01-21 DIAGNOSIS — R634 Abnormal weight loss: Secondary | ICD-10-CM | POA: Diagnosis not present

## 2016-01-21 DIAGNOSIS — I25118 Atherosclerotic heart disease of native coronary artery with other forms of angina pectoris: Secondary | ICD-10-CM | POA: Diagnosis not present

## 2016-01-22 DIAGNOSIS — I25118 Atherosclerotic heart disease of native coronary artery with other forms of angina pectoris: Secondary | ICD-10-CM | POA: Diagnosis not present

## 2016-01-22 DIAGNOSIS — N183 Chronic kidney disease, stage 3 (moderate): Secondary | ICD-10-CM | POA: Diagnosis not present

## 2016-01-22 DIAGNOSIS — D631 Anemia in chronic kidney disease: Secondary | ICD-10-CM | POA: Diagnosis not present

## 2016-01-22 DIAGNOSIS — I209 Angina pectoris, unspecified: Secondary | ICD-10-CM | POA: Diagnosis not present

## 2016-01-22 DIAGNOSIS — I129 Hypertensive chronic kidney disease with stage 1 through stage 4 chronic kidney disease, or unspecified chronic kidney disease: Secondary | ICD-10-CM | POA: Diagnosis not present

## 2016-01-22 DIAGNOSIS — R634 Abnormal weight loss: Secondary | ICD-10-CM | POA: Diagnosis not present

## 2016-01-23 DIAGNOSIS — I209 Angina pectoris, unspecified: Secondary | ICD-10-CM | POA: Diagnosis not present

## 2016-01-23 DIAGNOSIS — I25118 Atherosclerotic heart disease of native coronary artery with other forms of angina pectoris: Secondary | ICD-10-CM | POA: Diagnosis not present

## 2016-01-23 DIAGNOSIS — N183 Chronic kidney disease, stage 3 (moderate): Secondary | ICD-10-CM | POA: Diagnosis not present

## 2016-01-23 DIAGNOSIS — D631 Anemia in chronic kidney disease: Secondary | ICD-10-CM | POA: Diagnosis not present

## 2016-01-23 DIAGNOSIS — R634 Abnormal weight loss: Secondary | ICD-10-CM | POA: Diagnosis not present

## 2016-01-23 DIAGNOSIS — I129 Hypertensive chronic kidney disease with stage 1 through stage 4 chronic kidney disease, or unspecified chronic kidney disease: Secondary | ICD-10-CM | POA: Diagnosis not present

## 2016-01-26 DIAGNOSIS — D631 Anemia in chronic kidney disease: Secondary | ICD-10-CM | POA: Diagnosis not present

## 2016-01-26 DIAGNOSIS — I209 Angina pectoris, unspecified: Secondary | ICD-10-CM | POA: Diagnosis not present

## 2016-01-26 DIAGNOSIS — I25118 Atherosclerotic heart disease of native coronary artery with other forms of angina pectoris: Secondary | ICD-10-CM | POA: Diagnosis not present

## 2016-01-26 DIAGNOSIS — N183 Chronic kidney disease, stage 3 (moderate): Secondary | ICD-10-CM | POA: Diagnosis not present

## 2016-01-26 DIAGNOSIS — I129 Hypertensive chronic kidney disease with stage 1 through stage 4 chronic kidney disease, or unspecified chronic kidney disease: Secondary | ICD-10-CM | POA: Diagnosis not present

## 2016-01-26 DIAGNOSIS — R634 Abnormal weight loss: Secondary | ICD-10-CM | POA: Diagnosis not present

## 2016-01-27 DIAGNOSIS — D631 Anemia in chronic kidney disease: Secondary | ICD-10-CM | POA: Diagnosis not present

## 2016-01-27 DIAGNOSIS — I209 Angina pectoris, unspecified: Secondary | ICD-10-CM | POA: Diagnosis not present

## 2016-01-27 DIAGNOSIS — R634 Abnormal weight loss: Secondary | ICD-10-CM | POA: Diagnosis not present

## 2016-01-27 DIAGNOSIS — N183 Chronic kidney disease, stage 3 (moderate): Secondary | ICD-10-CM | POA: Diagnosis not present

## 2016-01-27 DIAGNOSIS — I129 Hypertensive chronic kidney disease with stage 1 through stage 4 chronic kidney disease, or unspecified chronic kidney disease: Secondary | ICD-10-CM | POA: Diagnosis not present

## 2016-01-27 DIAGNOSIS — I25118 Atherosclerotic heart disease of native coronary artery with other forms of angina pectoris: Secondary | ICD-10-CM | POA: Diagnosis not present

## 2016-01-28 DIAGNOSIS — I129 Hypertensive chronic kidney disease with stage 1 through stage 4 chronic kidney disease, or unspecified chronic kidney disease: Secondary | ICD-10-CM | POA: Diagnosis not present

## 2016-01-28 DIAGNOSIS — M810 Age-related osteoporosis without current pathological fracture: Secondary | ICD-10-CM | POA: Diagnosis not present

## 2016-01-28 DIAGNOSIS — F0390 Unspecified dementia without behavioral disturbance: Secondary | ICD-10-CM | POA: Diagnosis not present

## 2016-01-28 DIAGNOSIS — N183 Chronic kidney disease, stage 3 (moderate): Secondary | ICD-10-CM | POA: Diagnosis not present

## 2016-01-28 DIAGNOSIS — R634 Abnormal weight loss: Secondary | ICD-10-CM | POA: Diagnosis not present

## 2016-01-28 DIAGNOSIS — I209 Angina pectoris, unspecified: Secondary | ICD-10-CM | POA: Diagnosis not present

## 2016-01-28 DIAGNOSIS — M353 Polymyalgia rheumatica: Secondary | ICD-10-CM | POA: Diagnosis not present

## 2016-01-28 DIAGNOSIS — F331 Major depressive disorder, recurrent, moderate: Secondary | ICD-10-CM | POA: Diagnosis not present

## 2016-01-28 DIAGNOSIS — D538 Other specified nutritional anemias: Secondary | ICD-10-CM | POA: Diagnosis not present

## 2016-01-28 DIAGNOSIS — K219 Gastro-esophageal reflux disease without esophagitis: Secondary | ICD-10-CM | POA: Diagnosis not present

## 2016-01-28 DIAGNOSIS — Z8719 Personal history of other diseases of the digestive system: Secondary | ICD-10-CM | POA: Diagnosis not present

## 2016-01-28 DIAGNOSIS — E785 Hyperlipidemia, unspecified: Secondary | ICD-10-CM | POA: Diagnosis not present

## 2016-01-28 DIAGNOSIS — D472 Monoclonal gammopathy: Secondary | ICD-10-CM | POA: Diagnosis not present

## 2016-01-28 DIAGNOSIS — D631 Anemia in chronic kidney disease: Secondary | ICD-10-CM | POA: Diagnosis not present

## 2016-01-28 DIAGNOSIS — I25118 Atherosclerotic heart disease of native coronary artery with other forms of angina pectoris: Secondary | ICD-10-CM | POA: Diagnosis not present

## 2016-01-28 DIAGNOSIS — E039 Hypothyroidism, unspecified: Secondary | ICD-10-CM | POA: Diagnosis not present

## 2016-02-06 ENCOUNTER — Telehealth: Payer: Self-pay

## 2016-02-06 NOTE — Telephone Encounter (Signed)
Sherry with Hospice left v/m; pt is resident at the [facility] at [facility]; pt has cough for 3 days with nasal drainage; Claritin prn is not helping symptoms; pt had tried robitussin that helped while taking 3 weeks ago. Lung sounds clear; O2 sat 92%; Temp 98.2 axillary. Do you want to try pt on robitussin again or try different med. Fax # for facility is [phone].

## 2016-02-06 NOTE — Telephone Encounter (Signed)
ok to restart robitussin. 5cc TID PRN cough.

## 2016-02-09 NOTE — Telephone Encounter (Signed)
Order faxed.

## 2016-02-12 ENCOUNTER — Other Ambulatory Visit: Payer: Self-pay | Admitting: Family Medicine

## 2016-02-12 ENCOUNTER — Telehealth: Payer: Self-pay

## 2016-02-12 NOTE — Telephone Encounter (Signed)
Sherrie Mustache nurse with [facility] left v/m; pt having new symptom of vaginal bleeding;passing small clots;small amt of active bleeding; pt denies any abd pain or discomfort,no N& V. BP 102/56 P 55. FYI to Dr Darnell Level; pt has appt to see Dr Darnell Level on 02/13/16 at 2:45 pm. sherri also request when pt seen on 02/13/16 to schedule tessalon perle at night time due to cough; med tech will not give unless pt ask for med and cough is affecting pts sleep.

## 2016-02-12 NOTE — Telephone Encounter (Signed)
Will see tomorrow Ok to schedule tessalon perls nightly.

## 2016-02-13 ENCOUNTER — Encounter: Payer: Self-pay | Admitting: Family Medicine

## 2016-02-13 ENCOUNTER — Ambulatory Visit (INDEPENDENT_AMBULATORY_CARE_PROVIDER_SITE_OTHER): Admitting: Family Medicine

## 2016-02-13 VITALS — BP 116/60 | HR 50 | Temp 98.1°F | Wt 112.0 lb

## 2016-02-13 DIAGNOSIS — N3001 Acute cystitis with hematuria: Secondary | ICD-10-CM

## 2016-02-13 DIAGNOSIS — Z515 Encounter for palliative care: Secondary | ICD-10-CM

## 2016-02-13 DIAGNOSIS — R05 Cough: Secondary | ICD-10-CM

## 2016-02-13 DIAGNOSIS — F0390 Unspecified dementia without behavioral disturbance: Secondary | ICD-10-CM

## 2016-02-13 DIAGNOSIS — R3981 Functional urinary incontinence: Secondary | ICD-10-CM | POA: Diagnosis not present

## 2016-02-13 DIAGNOSIS — R059 Cough, unspecified: Secondary | ICD-10-CM

## 2016-02-13 LAB — POC URINALSYSI DIPSTICK (AUTOMATED)
BILIRUBIN UA: NEGATIVE
GLUCOSE UA: NEGATIVE
KETONES UA: NEGATIVE
Nitrite, UA: POSITIVE
PH UA: 6
SPEC GRAV UA: 1.025
Urobilinogen, UA: NEGATIVE

## 2016-02-13 MED ORDER — CEPHALEXIN 500 MG PO CAPS: 500.0000 mg | ORAL_CAPSULE | Freq: Two times a day (BID) | ORAL | Status: DC

## 2016-02-13 MED ORDER — FLUTICASONE PROPIONATE 50 MCG/ACT NA SUSP: 2.0000 | Freq: Every day | NASAL | Status: AC

## 2016-02-13 NOTE — Patient Instructions (Signed)
I think you have UTI - treat with keflex antibiotic 7d course. For post nasal drainage and cough - try flonase nasal spray sent to pharmacy

## 2016-02-13 NOTE — Progress Notes (Signed)
Pre visit review using our clinic review tool, if applicable. No additional management support is needed unless otherwise documented below in the visit note. 

## 2016-02-13 NOTE — Progress Notes (Signed)
BP 116/60 mmHg  Pulse 50  Temp(Src) 98.1 F (36.7 C) (Oral)  Wt 112 lb (50.803 kg)  SpO2 96%   CC: ?vaginal bleed  Subjective:    Patient ID: Becky Adkins, female    DOB: [birth year], 82 y.o.   MRN: [id number]  HPI: Becky Adkins is a 82 y.o. female presenting on 02/13/2016 for Vaginal Bleeding   Presents with Becky Diane Killion RN.  Hospice nurse found some blood in underwear over last 2 days. Unsure source. Urine incontinent overall, wears depens regularly. Normal stools.   She is on aspirin daily but no other blood thinner.  Overall feels well, denies fevers, dysuria, vag itching, abd pain. She did have neg iFOB 12/2015.  Also - ongoing rattling cough present for last month. Becky states this occurs yearly at this time. Treating with robitussin and daily nonsedating antihistamine without significant improvement.  On hospice for dementia and failure to thrive.  Relevant past medical, surgical, family and social history reviewed and updated as indicated. Interim medical history since our last visit reviewed. Allergies and medications reviewed and updated. Current Outpatient Prescriptions on File Prior to Visit  Medication Sig  . alum & mag hydroxide-simeth (MAALOX/MYLANTA) 200-200-20 MG/5ML suspension Take 5 mLs by mouth every 6 (six) hours as needed for indigestion or heartburn.  Marland Kitchen aspirin 81 MG chewable tablet Chew 81 mg by mouth daily.  Marland Kitchen atorvastatin (LIPITOR) 20 MG tablet Take 1 tablet (20 mg total) by mouth daily.  . benzonatate (TESSALON) 100 MG capsule Take 1 capsule (100 mg total) by mouth at bedtime. And otherwise tid as needed  . citalopram (CELEXA) 20 MG tablet TAKE 1 & 1/2 TABLETS BY MOUTH IN THE MORNING. (DEPRESSION)  . cyanocobalamin 500 MCG tablet Take 500 mcg by mouth daily.  . ergocalciferol (VITAMIN D2) 50000 units capsule Take 50,000 Units by mouth once a week.  . ferrous fumarate (HEMOCYTE - 106 MG FE) 325 (106 Fe) MG TABS tablet  Take 1 tablet by mouth daily.  . isosorbide mononitrate (IMDUR) 30 MG 24 hr tablet TAKE ONE TABLET BY MOUTH TWICE DAILY FOR HEART.  Marland Kitchen levothyroxine (SYNTHROID, LEVOTHROID) 75 MCG tablet Take 1 tablet (75 mcg total) by mouth daily before breakfast.  . loratadine (CLARITIN) 5 MG chewable tablet 5 mg a day for 1 week, increase to 10mg  a day if needed thereafter for runny nose.  . Multiple Vitamins-Minerals (PRESERVISION AREDS) TABS TAKE ONE TABLET BY MOUTH ONCE DAILY IN THE AM FOR SUPPLEMENT  . nitroGLYCERIN (NITROSTAT) 0.4 MG SL tablet Place 0.4 mg under the tongue every 5 (five) minutes as needed.    . pantoprazole (PROTONIX) 40 MG tablet TAKE 1 TABLET BY MOUTH EACH MORNING. (GERD) **DO NOT CRUSH**  . ranitidine (ZANTAC) 150 MG capsule Take 150 mg by mouth 2 (two) times daily.  . ranolazine (RANEXA) 500 MG 12 hr tablet Take 1 tablet (500 mg total) by mouth 2 (two) times daily.  . traZODone (DESYREL) 50 MG tablet TAKE 1/2 TABLET BY MOUTH EACH NIGHT AT BEDTIME. (SLEEP/DEPRESSION)   No current facility-administered medications on file prior to visit.    Review of Systems Per HPI unless specifically indicated in ROS section     Objective:    BP 116/60 mmHg  Pulse 50  Temp(Src) 98.1 F (36.7 C) (Oral)  Wt 112 lb (50.803 kg)  SpO2 96%  Wt Readings from Last 3 Encounters:  02/13/16 112 lb (50.803 kg)  12/31/15 120 lb (54.432 kg)  12/17/15 113 lb (  51.256 kg)    Physical Exam  Constitutional: She appears well-developed and well-nourished. No distress.  HENT:  Head: Normocephalic and atraumatic.  Right Ear: Hearing, tympanic membrane, external ear and ear canal normal.  Left Ear: Hearing, tympanic membrane, external ear and ear canal normal.  Nose: Mucosal edema present. No rhinorrhea.  Mouth/Throat: Uvula is midline, oropharynx is clear and moist and mucous membranes are normal. No oropharyngeal exudate, posterior oropharyngeal edema, posterior oropharyngeal erythema or tonsillar  abscesses.  Nasal mucosal congestion evident Oropharynx clear  Eyes: Conjunctivae and EOM are normal. Pupils are equal, round, and reactive to light. No scleral icterus.  Neck: Normal range of motion. Neck supple.  Cardiovascular: Normal rate, regular rhythm, normal heart sounds and intact distal pulses.   No murmur heard. Pulmonary/Chest: Effort normal and breath sounds normal. No respiratory distress. She has no wheezes. She has no rales.  Lungs overall clear  Abdominal: Soft. Bowel sounds are normal. She exhibits no distension and no mass. There is no hepatosplenomegaly. There is no tenderness. There is no rebound, no guarding and no CVA tenderness.  Genitourinary:  External GYN exam performed with Becky's asssitance No external rash, no external bleeding appreciated Slight discolored discharge present on depens  Lymphadenopathy:    She has no cervical adenopathy.  Skin: Skin is warm and dry. No rash noted.  Psychiatric:  Pleasant, cooperative but with difficulty following commands  Nursing note and vitals reviewed.  Results for orders placed or performed in visit on 02/13/16  POCT Urinalysis Dipstick (Automated)  Result Value Ref Range   Color, UA dark yellow    Clarity, UA     Glucose, UA neg    Bilirubin, UA neg    Ketones, UA neg    Spec Grav, UA 1.025    Blood, UA large    pH, UA 6.0    Protein, UA trace    Urobilinogen, UA negative    Nitrite, UA positive    Leukocytes, UA moderate (2+) (A) Negative      Assessment & Plan:   Problem List Items Addressed This Visit    UTI (urinary tract infection) - Primary    Few drops urine provided - UA suspicious for infection (+ LE, nitrate, blood). Treat as such with 1 wk keflex course. Not enough sample to send culture. Update if persistent bleeding noted, consider empiric treatment for vaginal infection and rpt iFOB.       Relevant Medications   cephALEXin (KEFLEX) 500 MG capsule   Urine incontinence    Baseline - wears  depens      Relevant Orders   POCT Urinalysis Dipstick (Automated) (Completed)   Hospice care patient   Dementia   Cough    Recurrent cough winter months. Lung exam benign. No improvement with robitussin/antihistamine - suspicious for post nasal drip - related cough so will add on flonase. Update with effect. Becky agrees with plan.          Follow up plan: No Follow-up on file.

## 2016-02-14 DIAGNOSIS — N390 Urinary tract infection, site not specified: Secondary | ICD-10-CM | POA: Insufficient documentation

## 2016-02-14 DIAGNOSIS — R05 Cough: Secondary | ICD-10-CM | POA: Insufficient documentation

## 2016-02-14 DIAGNOSIS — Z515 Encounter for palliative care: Secondary | ICD-10-CM | POA: Insufficient documentation

## 2016-02-14 DIAGNOSIS — R32 Unspecified urinary incontinence: Secondary | ICD-10-CM | POA: Insufficient documentation

## 2016-02-14 DIAGNOSIS — R059 Cough, unspecified: Secondary | ICD-10-CM | POA: Insufficient documentation

## 2016-02-14 NOTE — Assessment & Plan Note (Signed)
Baseline - wears depens

## 2016-02-14 NOTE — Assessment & Plan Note (Signed)
Few drops urine provided - UA suspicious for infection (+ LE, nitrate, blood). Treat as such with 1 wk keflex course. Not enough sample to send culture. Update if persistent bleeding noted, consider empiric treatment for vaginal infection and rpt iFOB.

## 2016-02-14 NOTE — Assessment & Plan Note (Signed)
Recurrent cough winter months. Lung exam benign. No improvement with robitussin/antihistamine - suspicious for post nasal drip - related cough so will add on flonase. Update with effect. DIL agrees with plan.

## 2016-02-25 DIAGNOSIS — Z8719 Personal history of other diseases of the digestive system: Secondary | ICD-10-CM | POA: Diagnosis not present

## 2016-02-25 DIAGNOSIS — N183 Chronic kidney disease, stage 3 (moderate): Secondary | ICD-10-CM | POA: Diagnosis not present

## 2016-02-25 DIAGNOSIS — F331 Major depressive disorder, recurrent, moderate: Secondary | ICD-10-CM | POA: Diagnosis not present

## 2016-02-25 DIAGNOSIS — D538 Other specified nutritional anemias: Secondary | ICD-10-CM | POA: Diagnosis not present

## 2016-02-25 DIAGNOSIS — D631 Anemia in chronic kidney disease: Secondary | ICD-10-CM | POA: Diagnosis not present

## 2016-02-25 DIAGNOSIS — R634 Abnormal weight loss: Secondary | ICD-10-CM | POA: Diagnosis not present

## 2016-02-25 DIAGNOSIS — E039 Hypothyroidism, unspecified: Secondary | ICD-10-CM | POA: Diagnosis not present

## 2016-02-25 DIAGNOSIS — M810 Age-related osteoporosis without current pathological fracture: Secondary | ICD-10-CM | POA: Diagnosis not present

## 2016-02-25 DIAGNOSIS — M353 Polymyalgia rheumatica: Secondary | ICD-10-CM | POA: Diagnosis not present

## 2016-02-25 DIAGNOSIS — E785 Hyperlipidemia, unspecified: Secondary | ICD-10-CM | POA: Diagnosis not present

## 2016-02-25 DIAGNOSIS — I129 Hypertensive chronic kidney disease with stage 1 through stage 4 chronic kidney disease, or unspecified chronic kidney disease: Secondary | ICD-10-CM | POA: Diagnosis not present

## 2016-02-25 DIAGNOSIS — I209 Angina pectoris, unspecified: Secondary | ICD-10-CM | POA: Diagnosis not present

## 2016-02-25 DIAGNOSIS — D472 Monoclonal gammopathy: Secondary | ICD-10-CM | POA: Diagnosis not present

## 2016-02-25 DIAGNOSIS — K219 Gastro-esophageal reflux disease without esophagitis: Secondary | ICD-10-CM | POA: Diagnosis not present

## 2016-02-25 DIAGNOSIS — I25118 Atherosclerotic heart disease of native coronary artery with other forms of angina pectoris: Secondary | ICD-10-CM | POA: Diagnosis not present

## 2016-02-25 DIAGNOSIS — F0390 Unspecified dementia without behavioral disturbance: Secondary | ICD-10-CM | POA: Diagnosis not present

## 2016-03-05 ENCOUNTER — Telehealth: Payer: Self-pay | Admitting: Family Medicine

## 2016-03-05 NOTE — Telephone Encounter (Signed)
Becky Adkins called checking on standing stating order careplan med list that she faxed over 3/8 She is checking status

## 2016-03-09 NOTE — Telephone Encounter (Signed)
Spoke with Joycelyn Schmid and advised that we had not received CP. She said she re-faxed it yesterday and got it back.

## 2016-03-27 DIAGNOSIS — I25118 Atherosclerotic heart disease of native coronary artery with other forms of angina pectoris: Secondary | ICD-10-CM | POA: Diagnosis not present

## 2016-03-27 DIAGNOSIS — M353 Polymyalgia rheumatica: Secondary | ICD-10-CM | POA: Diagnosis not present

## 2016-03-27 DIAGNOSIS — Z8719 Personal history of other diseases of the digestive system: Secondary | ICD-10-CM | POA: Diagnosis not present

## 2016-03-27 DIAGNOSIS — E039 Hypothyroidism, unspecified: Secondary | ICD-10-CM | POA: Diagnosis not present

## 2016-03-27 DIAGNOSIS — F331 Major depressive disorder, recurrent, moderate: Secondary | ICD-10-CM | POA: Diagnosis not present

## 2016-03-27 DIAGNOSIS — F0390 Unspecified dementia without behavioral disturbance: Secondary | ICD-10-CM | POA: Diagnosis not present

## 2016-03-27 DIAGNOSIS — D538 Other specified nutritional anemias: Secondary | ICD-10-CM | POA: Diagnosis not present

## 2016-03-27 DIAGNOSIS — E785 Hyperlipidemia, unspecified: Secondary | ICD-10-CM | POA: Diagnosis not present

## 2016-03-27 DIAGNOSIS — D472 Monoclonal gammopathy: Secondary | ICD-10-CM | POA: Diagnosis not present

## 2016-03-27 DIAGNOSIS — I129 Hypertensive chronic kidney disease with stage 1 through stage 4 chronic kidney disease, or unspecified chronic kidney disease: Secondary | ICD-10-CM | POA: Diagnosis not present

## 2016-03-27 DIAGNOSIS — M810 Age-related osteoporosis without current pathological fracture: Secondary | ICD-10-CM | POA: Diagnosis not present

## 2016-03-27 DIAGNOSIS — I209 Angina pectoris, unspecified: Secondary | ICD-10-CM | POA: Diagnosis not present

## 2016-03-27 DIAGNOSIS — D631 Anemia in chronic kidney disease: Secondary | ICD-10-CM | POA: Diagnosis not present

## 2016-03-27 DIAGNOSIS — K219 Gastro-esophageal reflux disease without esophagitis: Secondary | ICD-10-CM | POA: Diagnosis not present

## 2016-03-27 DIAGNOSIS — R634 Abnormal weight loss: Secondary | ICD-10-CM | POA: Diagnosis not present

## 2016-03-27 DIAGNOSIS — N183 Chronic kidney disease, stage 3 (moderate): Secondary | ICD-10-CM | POA: Diagnosis not present

## 2016-04-16 ENCOUNTER — Encounter: Payer: Self-pay | Admitting: Family Medicine

## 2016-04-16 ENCOUNTER — Ambulatory Visit (INDEPENDENT_AMBULATORY_CARE_PROVIDER_SITE_OTHER): Payer: Medicare Other | Admitting: Family Medicine

## 2016-04-16 VITALS — BP 122/72 | HR 81 | Temp 97.7°F | Wt 106.8 lb

## 2016-04-16 DIAGNOSIS — Z515 Encounter for palliative care: Secondary | ICD-10-CM | POA: Diagnosis not present

## 2016-04-16 DIAGNOSIS — F03C0 Unspecified dementia, severe, without behavioral disturbance, psychotic disturbance, mood disturbance, and anxiety: Secondary | ICD-10-CM

## 2016-04-16 DIAGNOSIS — D472 Monoclonal gammopathy: Secondary | ICD-10-CM

## 2016-04-16 DIAGNOSIS — F331 Major depressive disorder, recurrent, moderate: Secondary | ICD-10-CM

## 2016-04-16 DIAGNOSIS — E43 Unspecified severe protein-calorie malnutrition: Secondary | ICD-10-CM | POA: Diagnosis not present

## 2016-04-16 DIAGNOSIS — F0390 Unspecified dementia without behavioral disturbance: Secondary | ICD-10-CM

## 2016-04-16 DIAGNOSIS — K029 Dental caries, unspecified: Secondary | ICD-10-CM | POA: Diagnosis not present

## 2016-04-16 DIAGNOSIS — Z66 Do not resuscitate: Secondary | ICD-10-CM

## 2016-04-16 NOTE — Progress Notes (Signed)
Pre visit review using our clinic review tool, if applicable. No additional management support is needed unless otherwise documented below in the visit note. 

## 2016-04-16 NOTE — Patient Instructions (Addendum)
I recommend starting 1 boost supplement 30 min prior to lunch.  Schedule follow up with dentist. Ensure daily oral hygiene - brushing teeth. Return in 3 months for follow up visit, sooner if needed. Stop zantac, lipitor and vitamin D.  Return to see dentist for dental evaluation

## 2016-04-16 NOTE — Progress Notes (Signed)
BP 122/72 mmHg  Pulse 81  Temp(Src) 97.7 F (36.5 C) (Oral)  Wt 106 lb 12 oz (48.421 kg)  SpO2 93% (78% was machine error)  CC: 3-4 mo f/u visit  Subjective:    Patient ID: Becky Adkins, female    DOB: [birth year], 82 y.o.   MRN: [id number]  HPI: Becky Adkins is a 82 y.o. female presenting on 04/16/2016 for Follow-up and tooth extraction   Presents with DIL Diane Arther RN. On hospice for dementia and failure to thrive. More disoriented.   Last visit treated for blood in urine /UTI - treated with keflex. Symptoms have fully resolved.  PMR - longterm prednisone stopped 2016. Denies significant arthralgias or stiffness.  Saw Dr Rockey Situ late last year - stable severe CAD.   Lost 2 lower front teeth a week ago. No trauma, just fell out. Unsure about dental hygiene.   Relevant past medical, surgical, family and social history reviewed and updated as indicated. Interim medical history since our last visit reviewed. Allergies and medications reviewed and updated. Current Outpatient Prescriptions on File Prior to Visit  Medication Sig  . alum & mag hydroxide-simeth (MAALOX/MYLANTA) 200-200-20 MG/5ML suspension Take 5 mLs by mouth every 6 (six) hours as needed for indigestion or heartburn.  Marland Kitchen aspirin 81 MG chewable tablet Chew 81 mg by mouth daily.  . benzonatate (TESSALON) 100 MG capsule Take 1 capsule (100 mg total) by mouth at bedtime. And otherwise tid as needed  . citalopram (CELEXA) 20 MG tablet TAKE 1 & 1/2 TABLETS BY MOUTH IN THE MORNING. (DEPRESSION)  . cyanocobalamin 500 MCG tablet Take 500 mcg by mouth daily.  . ferrous fumarate (HEMOCYTE - 106 MG FE) 325 (106 Fe) MG TABS tablet Take 1 tablet by mouth daily.  . fluticasone (FLONASE) 50 MCG/ACT nasal spray Place 2 sprays into both nostrils daily.  . isosorbide mononitrate (IMDUR) 30 MG 24 hr tablet TAKE ONE TABLET BY MOUTH TWICE DAILY FOR HEART.  Marland Kitchen levothyroxine (SYNTHROID, LEVOTHROID) 75 MCG  tablet Take 1 tablet (75 mcg total) by mouth daily before breakfast.  . loratadine (CLARITIN) 5 MG chewable tablet 5 mg a day for 1 week, increase to 10mg  a day if needed thereafter for runny nose.  . Multiple Vitamins-Minerals (PRESERVISION AREDS) TABS TAKE ONE TABLET BY MOUTH ONCE DAILY IN THE AM FOR SUPPLEMENT  . nitroGLYCERIN (NITROSTAT) 0.4 MG SL tablet Place 0.4 mg under the tongue every 5 (five) minutes as needed.    . pantoprazole (PROTONIX) 40 MG tablet TAKE 1 TABLET BY MOUTH EACH MORNING. (GERD) **DO NOT CRUSH**  . ranolazine (RANEXA) 500 MG 12 hr tablet Take 1 tablet (500 mg total) by mouth 2 (two) times daily.  . traZODone (DESYREL) 50 MG tablet TAKE 1/2 TABLET BY MOUTH EACH NIGHT AT BEDTIME. (SLEEP/DEPRESSION)   No current facility-administered medications on file prior to visit.    Review of Systems Per HPI unless specifically indicated in ROS section     Objective:    BP 122/72 mmHg  Pulse 81  Temp(Src) 97.7 F (36.5 C) (Oral)  Wt 106 lb 12 oz (48.421 kg)  SpO2 93%  Wt Readings from Last 3 Encounters:  04/16/16 106 lb 12 oz (48.421 kg)  02/13/16 112 lb (50.803 kg)  12/31/15 120 lb (54.432 kg)    Physical Exam  Constitutional: She appears well-developed and well-nourished. No distress.  HENT:  Mouth/Throat: Uvula is midline. Mucous membranes are dry. Abnormal dentition. Dental caries present. No oropharyngeal exudate, posterior oropharyngeal  edema or posterior oropharyngeal erythema.    Poor dentition with 2 central inferior incisors absent, several other superior teeth loose. Evidence of caries and dental decay present throughout mouth. No significant pain to palpation. Dry mucous membranes  Cardiovascular: Normal rate, regular rhythm, normal heart sounds and intact distal pulses.   No murmur heard. Pulmonary/Chest: Effort normal and breath sounds normal. No respiratory distress. She has no wheezes. She has no rales.  Musculoskeletal: She exhibits no edema.    Skin: Skin is warm and dry. No rash noted.  Psychiatric:  Disoriented, [place], redirects well  Nursing note and vitals reviewed.     Assessment & Plan:   Problem List Items Addressed This Visit    Severe dementia    Progressive decline. Did recognize DIL today.      MDD (major depressive disorder), recurrent episode, moderate ([place])    Had more irritable/aggressive day during Easter, otherwise doing well with mood on celexa       DNR (do not resuscitate)   Protein-calorie malnutrition, severe ([place])    160 (10/2013) --> 106 lbs today ongoing weight loss. Concern for progressive malnutrition now with tooth loss - rec start ensure/boost nutritional supplement 30 min prior to meals as orexigenic trial.       IgA monoclonal gammopathy    We have decided to forego further eval.       Hospice care patient    Reviewed med regimen - suggested ok to d/c vit D, lipitor, zantac.       Dental caries - Primary    No known trauma. Poor dentition throughout. rec return to dentist.  ?malnutrition contributing (evidenced by weight loss and deteriorated PO intake) - suggested start nutritional supplement. See above.          Follow up plan: Return in about 3 months (around 07/16/2016), or as needed, for follow up visit.  Ria Bush, MD

## 2016-04-17 ENCOUNTER — Encounter: Payer: Self-pay | Admitting: Family Medicine

## 2016-04-17 DIAGNOSIS — K029 Dental caries, unspecified: Secondary | ICD-10-CM | POA: Insufficient documentation

## 2016-04-17 NOTE — Assessment & Plan Note (Signed)
Reviewed med regimen - suggested ok to d/c vit D, lipitor, zantac.

## 2016-04-17 NOTE — Assessment & Plan Note (Signed)
We have decided to forego further eval.

## 2016-04-17 NOTE — Assessment & Plan Note (Signed)
Progressive decline. Did recognize DIL today.

## 2016-04-17 NOTE — Assessment & Plan Note (Signed)
No known trauma. Poor dentition throughout. rec return to dentist.  ?malnutrition contributing (evidenced by weight loss and deteriorated PO intake) - suggested start nutritional supplement. See above.

## 2016-04-17 NOTE — Assessment & Plan Note (Signed)
160 (10/2013) --> 106 lbs today ongoing weight loss. Concern for progressive malnutrition now with tooth loss - rec start ensure/boost nutritional supplement 30 min prior to meals as orexigenic trial.

## 2016-04-17 NOTE — Assessment & Plan Note (Signed)
Had more irritable/aggressive day during Easter, otherwise doing well with mood on celexa

## 2016-04-19 ENCOUNTER — Telehealth: Payer: Self-pay | Admitting: *Deleted

## 2016-04-19 NOTE — Telephone Encounter (Signed)
Stacey with Hospice left a voicemail starting that patient had a large hard BM and is in pain now because of hemorrhoids. Erline Levine wants to know if they can an order to increase her Senna S and have an order for hemorrhoidal cream or suppositories? Call [place] back at [phone]

## 2016-04-19 NOTE — Telephone Encounter (Signed)
Ok to increase senna S to BID PRN constipation. May use anusol hc cream to hemorrhoids BID PRN max 5 days in a row.

## 2016-04-20 NOTE — Telephone Encounter (Signed)
Received faxed request from hospice - will try miralax 17gm once daily, continue senna S, use prep H twice daily + PRN.

## 2016-04-20 NOTE — Telephone Encounter (Signed)
Order faxed to Hospice

## 2016-04-20 NOTE — Telephone Encounter (Signed)
Per Erline Levine a fax was sent with an order for miralax and for an ointment for hemorrhoids. Waiting on fax.

## 2016-04-25 DIAGNOSIS — I25118 Atherosclerotic heart disease of native coronary artery with other forms of angina pectoris: Secondary | ICD-10-CM | POA: Diagnosis not present

## 2016-04-25 DIAGNOSIS — I129 Hypertensive chronic kidney disease with stage 1 through stage 4 chronic kidney disease, or unspecified chronic kidney disease: Secondary | ICD-10-CM | POA: Diagnosis not present

## 2016-04-25 DIAGNOSIS — N183 Chronic kidney disease, stage 3 (moderate): Secondary | ICD-10-CM | POA: Diagnosis not present

## 2016-04-25 DIAGNOSIS — I209 Angina pectoris, unspecified: Secondary | ICD-10-CM | POA: Diagnosis not present

## 2016-04-26 DIAGNOSIS — N183 Chronic kidney disease, stage 3 (moderate): Secondary | ICD-10-CM | POA: Diagnosis not present

## 2016-04-26 DIAGNOSIS — F0390 Unspecified dementia without behavioral disturbance: Secondary | ICD-10-CM | POA: Diagnosis not present

## 2016-04-26 DIAGNOSIS — I129 Hypertensive chronic kidney disease with stage 1 through stage 4 chronic kidney disease, or unspecified chronic kidney disease: Secondary | ICD-10-CM | POA: Diagnosis not present

## 2016-04-26 DIAGNOSIS — E039 Hypothyroidism, unspecified: Secondary | ICD-10-CM | POA: Diagnosis not present

## 2016-04-26 DIAGNOSIS — R634 Abnormal weight loss: Secondary | ICD-10-CM | POA: Diagnosis not present

## 2016-04-26 DIAGNOSIS — I25118 Atherosclerotic heart disease of native coronary artery with other forms of angina pectoris: Secondary | ICD-10-CM | POA: Diagnosis not present

## 2016-04-26 DIAGNOSIS — Z8719 Personal history of other diseases of the digestive system: Secondary | ICD-10-CM | POA: Diagnosis not present

## 2016-04-26 DIAGNOSIS — F331 Major depressive disorder, recurrent, moderate: Secondary | ICD-10-CM | POA: Diagnosis not present

## 2016-04-26 DIAGNOSIS — I209 Angina pectoris, unspecified: Secondary | ICD-10-CM | POA: Diagnosis not present

## 2016-04-26 DIAGNOSIS — M353 Polymyalgia rheumatica: Secondary | ICD-10-CM | POA: Diagnosis not present

## 2016-04-26 DIAGNOSIS — K219 Gastro-esophageal reflux disease without esophagitis: Secondary | ICD-10-CM | POA: Diagnosis not present

## 2016-04-26 DIAGNOSIS — M810 Age-related osteoporosis without current pathological fracture: Secondary | ICD-10-CM | POA: Diagnosis not present

## 2016-04-26 DIAGNOSIS — D472 Monoclonal gammopathy: Secondary | ICD-10-CM | POA: Diagnosis not present

## 2016-04-26 DIAGNOSIS — D538 Other specified nutritional anemias: Secondary | ICD-10-CM | POA: Diagnosis not present

## 2016-04-26 DIAGNOSIS — D631 Anemia in chronic kidney disease: Secondary | ICD-10-CM | POA: Diagnosis not present

## 2016-04-26 DIAGNOSIS — E785 Hyperlipidemia, unspecified: Secondary | ICD-10-CM | POA: Diagnosis not present

## 2016-05-14 ENCOUNTER — Telehealth: Payer: Self-pay

## 2016-05-14 MED ORDER — DOXYCYCLINE HYCLATE 100 MG PO TABS: 100.0000 mg | ORAL_TABLET | Freq: Two times a day (BID) | ORAL | Status: DC

## 2016-05-14 NOTE — Telephone Encounter (Signed)
[name] with [facility] left v/m; earlier today pt had episode of choking; pt was able to clear the choking episode but now has low grade fever. [name] called back and temp 101.7. Pt also having N&V.[name] not sure about aspiration or UTI. Becky Adkins said she is feeling better now and Becky Adkins wants to know what to do. Becky Adkins is still at pts home. Temp now is 102.

## 2016-05-14 NOTE — Telephone Encounter (Signed)
Dr Darnell Level said to do urinalysis and if U/A abnormal to do culture; send Doxycycline 100 mg # 14 taking one tab bid for [facility]; [name] will report back next week and there will be a hospice nurse see pt on 05/15/16.FYI to Dr Darnell Level.

## 2016-05-15 NOTE — Telephone Encounter (Signed)
Noted agree

## 2016-05-19 NOTE — Telephone Encounter (Signed)
Received UA/micro and culture - concern for infection with UCx growing >100k proteus sensitive to augmentin, cephalothin, cipro, levo, bactrim but resistant to tetracycline and nitrofurantoin.  PCN allergy.  rec stop doxy started over weekend, start cipro 250mg  twice daily for 5 days. Culture placed in Kims' box for scanning.

## 2016-05-19 NOTE — Telephone Encounter (Signed)
[name] notified and will take care of calling in medication.

## 2016-05-27 DIAGNOSIS — D631 Anemia in chronic kidney disease: Secondary | ICD-10-CM | POA: Diagnosis not present

## 2016-05-27 DIAGNOSIS — M353 Polymyalgia rheumatica: Secondary | ICD-10-CM | POA: Diagnosis not present

## 2016-05-27 DIAGNOSIS — D472 Monoclonal gammopathy: Secondary | ICD-10-CM | POA: Diagnosis not present

## 2016-05-27 DIAGNOSIS — F0390 Unspecified dementia without behavioral disturbance: Secondary | ICD-10-CM | POA: Diagnosis not present

## 2016-05-27 DIAGNOSIS — I209 Angina pectoris, unspecified: Secondary | ICD-10-CM | POA: Diagnosis not present

## 2016-05-27 DIAGNOSIS — E785 Hyperlipidemia, unspecified: Secondary | ICD-10-CM | POA: Diagnosis not present

## 2016-05-27 DIAGNOSIS — R634 Abnormal weight loss: Secondary | ICD-10-CM | POA: Diagnosis not present

## 2016-05-27 DIAGNOSIS — I25118 Atherosclerotic heart disease of native coronary artery with other forms of angina pectoris: Secondary | ICD-10-CM | POA: Diagnosis not present

## 2016-05-27 DIAGNOSIS — E039 Hypothyroidism, unspecified: Secondary | ICD-10-CM | POA: Diagnosis not present

## 2016-05-27 DIAGNOSIS — M810 Age-related osteoporosis without current pathological fracture: Secondary | ICD-10-CM | POA: Diagnosis not present

## 2016-05-27 DIAGNOSIS — D538 Other specified nutritional anemias: Secondary | ICD-10-CM | POA: Diagnosis not present

## 2016-05-27 DIAGNOSIS — I129 Hypertensive chronic kidney disease with stage 1 through stage 4 chronic kidney disease, or unspecified chronic kidney disease: Secondary | ICD-10-CM | POA: Diagnosis not present

## 2016-05-27 DIAGNOSIS — Z8719 Personal history of other diseases of the digestive system: Secondary | ICD-10-CM | POA: Diagnosis not present

## 2016-05-27 DIAGNOSIS — N183 Chronic kidney disease, stage 3 (moderate): Secondary | ICD-10-CM | POA: Diagnosis not present

## 2016-05-27 DIAGNOSIS — K219 Gastro-esophageal reflux disease without esophagitis: Secondary | ICD-10-CM | POA: Diagnosis not present

## 2016-05-27 DIAGNOSIS — F331 Major depressive disorder, recurrent, moderate: Secondary | ICD-10-CM | POA: Diagnosis not present

## 2016-05-29 IMAGING — US US ABDOMEN COMPLETE
1 series · 14 of 25 positions shown · non-contrast
Comparison: None.

CLINICAL DATA: Chronic kidney disease with anemia.  Weight loss

EXAM:
ABDOMEN ULTRASOUND COMPLETE

[Series 1: us abdomen complete · 0.15mm/px · 14 of 123 slices shown]
[im 1/123]
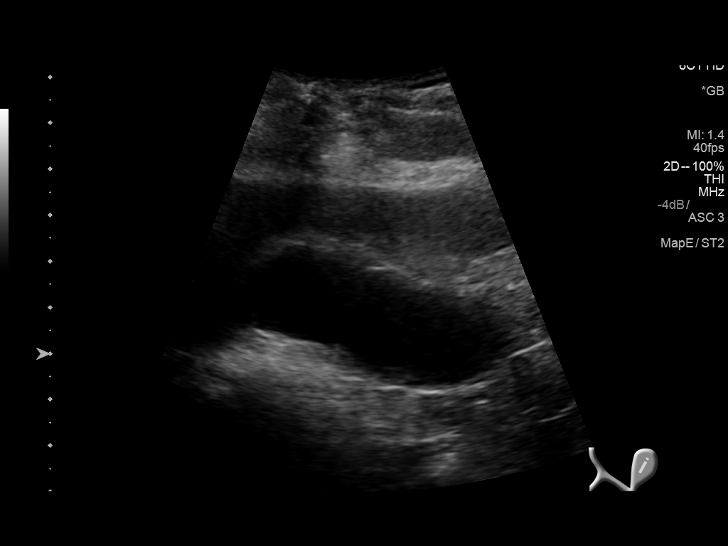
[im 11/123]
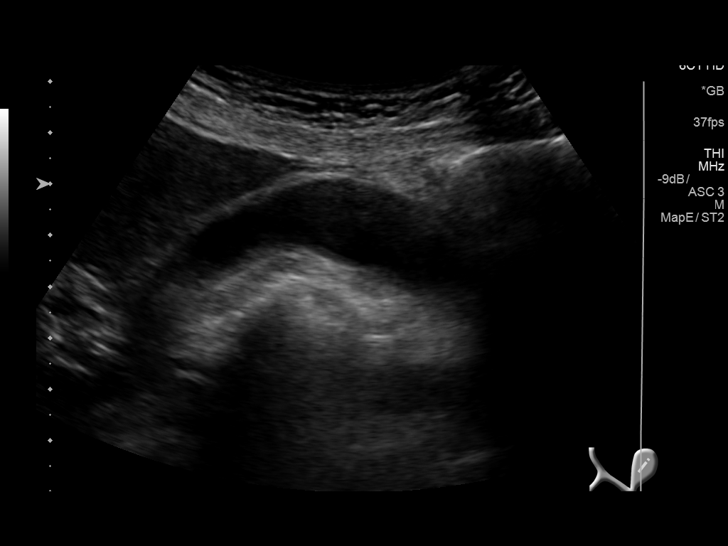
[im 21/123]
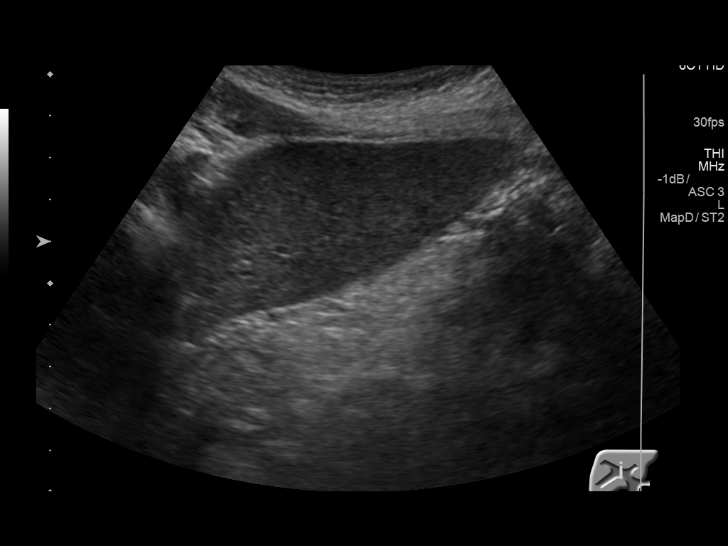
[im 31/123]
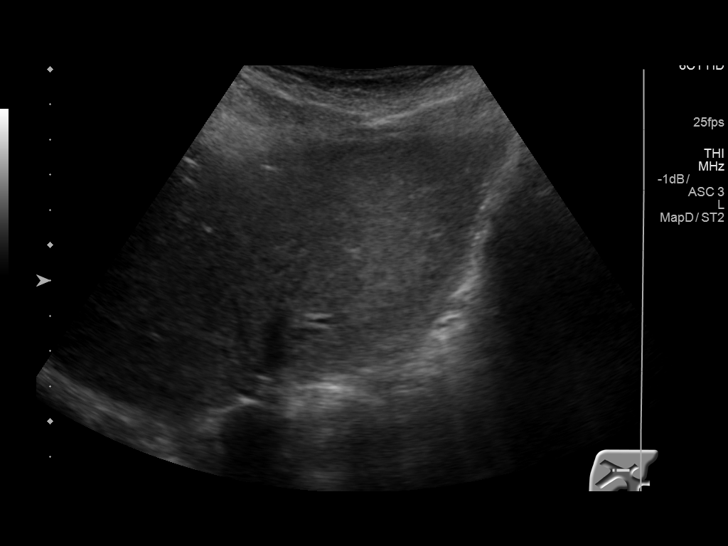
[im 41/123]
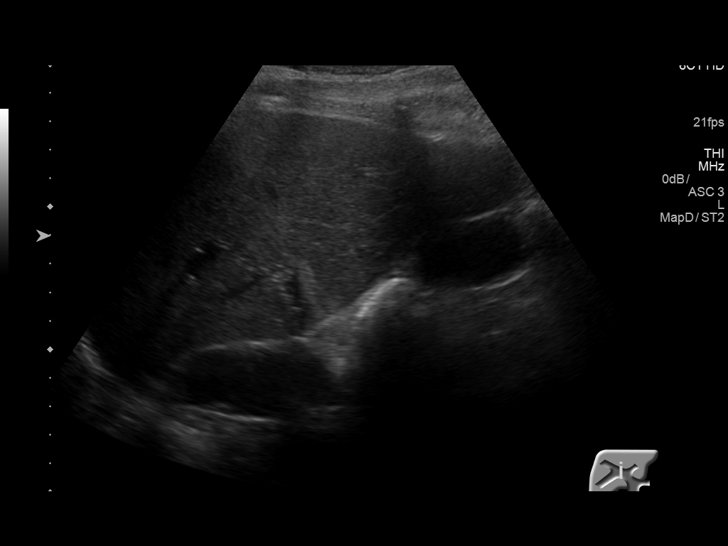
[im 46/123]
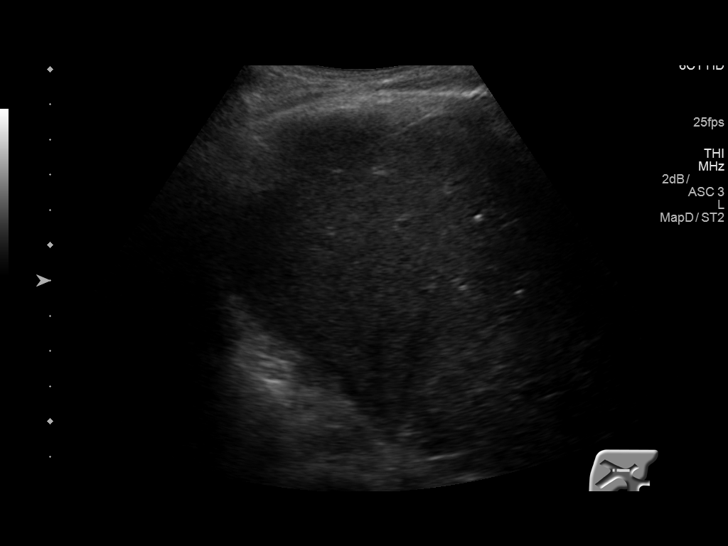
[im 56/123]
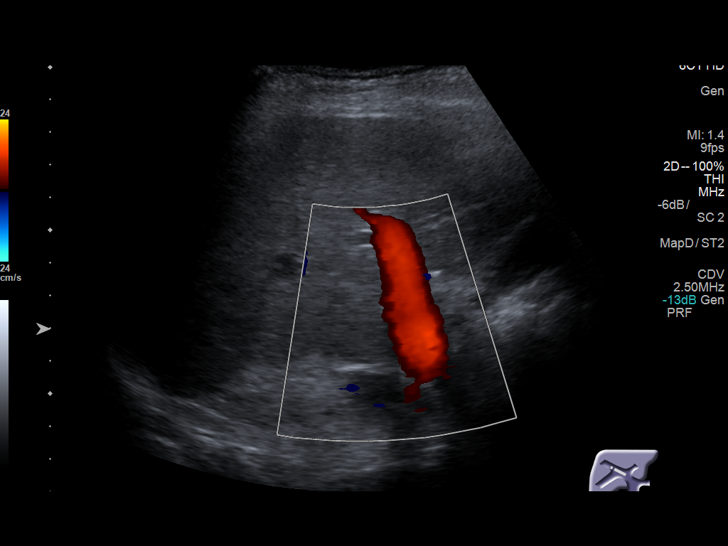
[im 67/123]
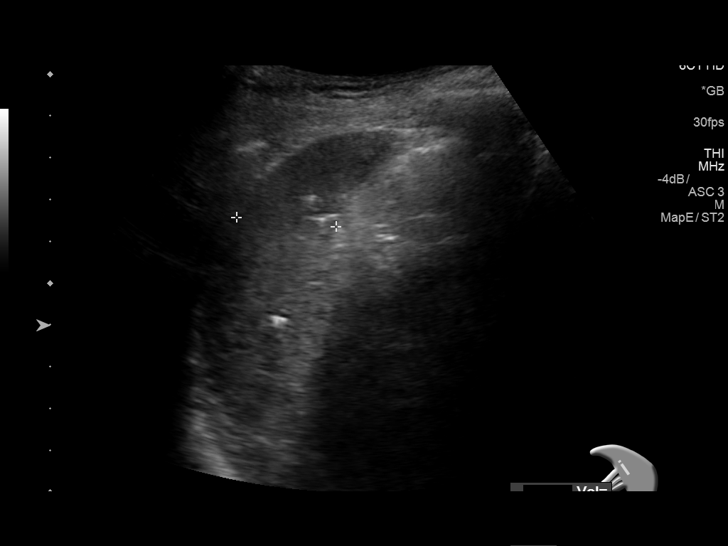
[im 77/123]
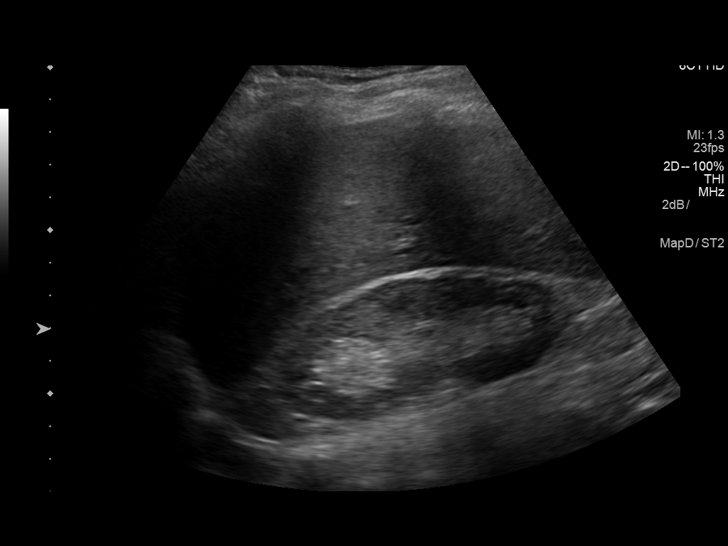
[im 82/123]
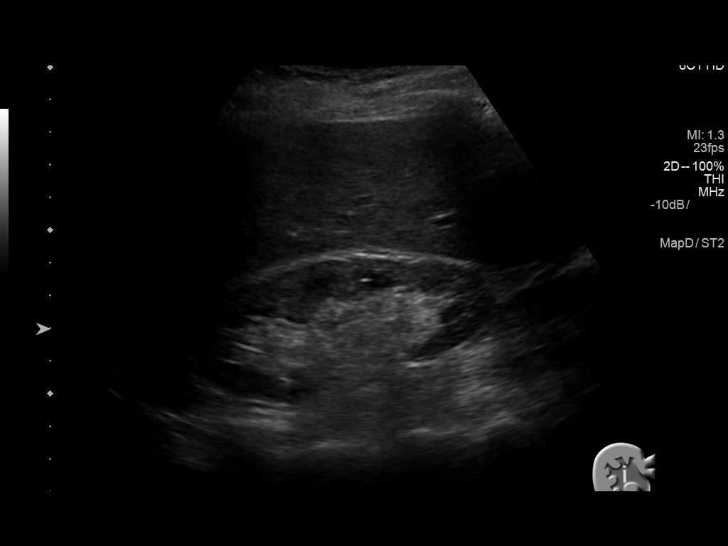
[im 92/123]
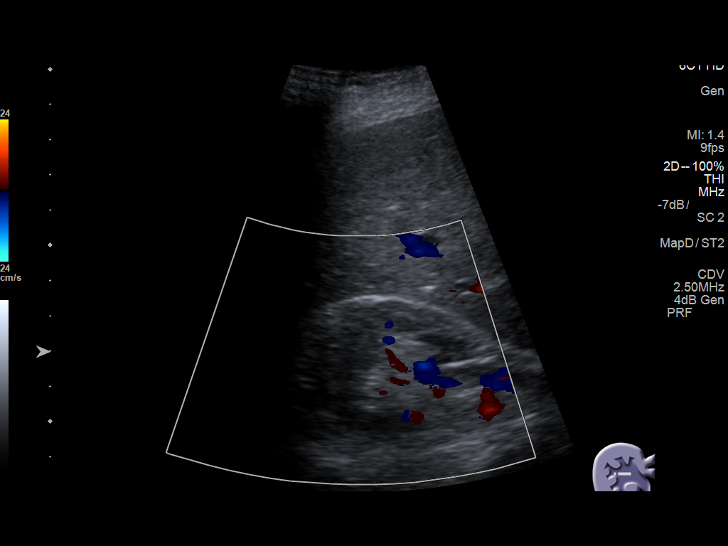
[im 102/123]
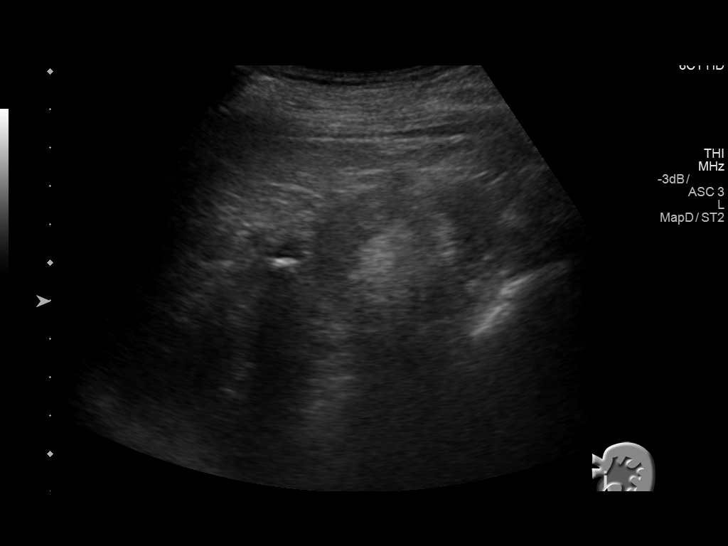
[im 112/123]
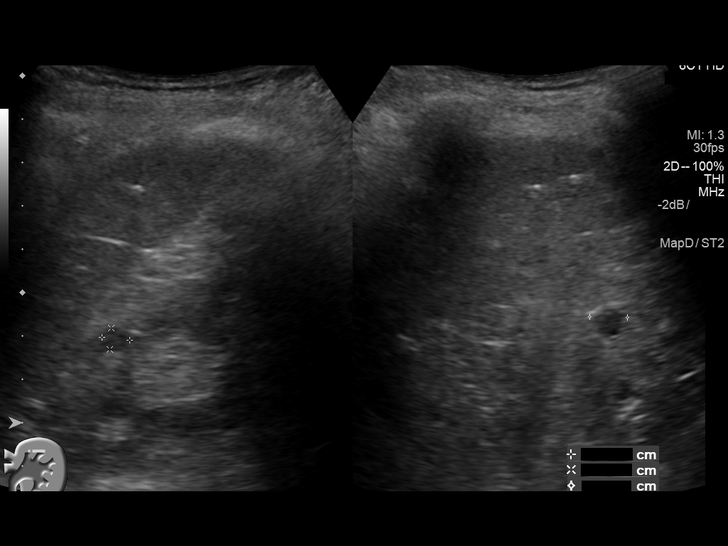
[im 123/123]
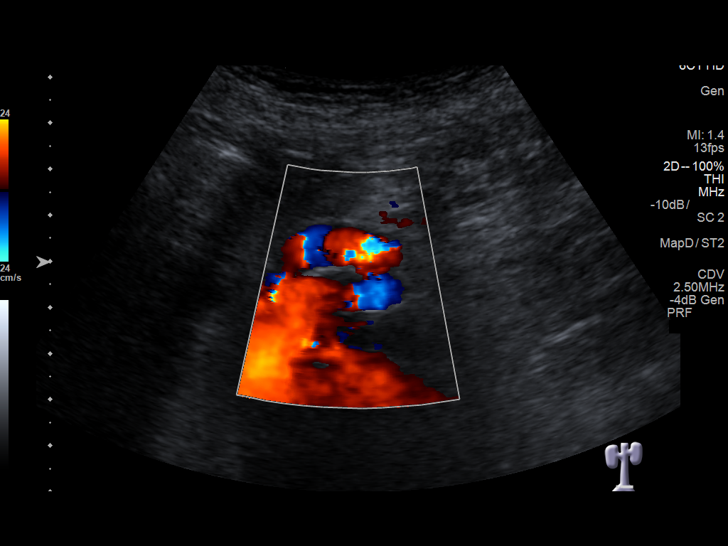

[14 of 25 positions shown; findings below may reference images not displayed]

FINDINGS: Gallbladder: No gallstones or wall thickening visualized. No
sonographic Murphy sign noted by sonographer.

Common bile duct: Diameter: 7.0 mm, upper normal in diameter.

Liver: Multiple calcified granulomata in the liver. No focal mass
lesion in the liver. Liver parenchyma normal in echogenicity.

IVC: No abnormality visualized.

Pancreas: Visualized portion unremarkable.

Spleen: Splenic size normal. Calcified granulomata throughout the
spleen.

Right Kidney: Length: 9.5 cm. 18 mm cyst midpole. 15 mm cyst lower
pole.. Echogenicity within normal limits. No mass or hydronephrosis
visualized.

Left Kidney: Length: 8.7 cm. 7 mm nonobstructing stone left upper
pole small left renal cyst.. Echogenicity within normal limits. No
mass or hydronephrosis visualized.

Abdominal aorta: No aneurysm visualized.

Other findings: None.
IMPRESSION: Negative for gallstones.  Common bile duct upper normal 7.0 mm

Bilateral renal cysts.  7 mm nonobstructing stone left upper pole.

## 2016-06-14 ENCOUNTER — Ambulatory Visit (INDEPENDENT_AMBULATORY_CARE_PROVIDER_SITE_OTHER): Payer: Medicare Other | Admitting: Internal Medicine

## 2016-06-14 ENCOUNTER — Telehealth: Payer: Self-pay | Admitting: *Deleted

## 2016-06-14 ENCOUNTER — Encounter: Payer: Self-pay | Admitting: Internal Medicine

## 2016-06-14 VITALS — BP 136/76 | Temp 98.4°F | Ht 61.0 in | Wt 104.4 lb

## 2016-06-14 DIAGNOSIS — F03C0 Unspecified dementia, severe, without behavioral disturbance, psychotic disturbance, mood disturbance, and anxiety: Secondary | ICD-10-CM

## 2016-06-14 DIAGNOSIS — M2669 Other specified disorders of temporomandibular joint: Secondary | ICD-10-CM | POA: Diagnosis not present

## 2016-06-14 DIAGNOSIS — R509 Fever, unspecified: Secondary | ICD-10-CM | POA: Diagnosis not present

## 2016-06-14 DIAGNOSIS — F0390 Unspecified dementia without behavioral disturbance: Secondary | ICD-10-CM

## 2016-06-14 DIAGNOSIS — R220 Localized swelling, mass and lump, head: Secondary | ICD-10-CM

## 2016-06-14 DIAGNOSIS — K029 Dental caries, unspecified: Secondary | ICD-10-CM | POA: Diagnosis not present

## 2016-06-14 DIAGNOSIS — Z880 Allergy status to penicillin: Secondary | ICD-10-CM

## 2016-06-14 MED ORDER — CEPHALEXIN 500 MG PO CAPS: 500.0000 mg | ORAL_CAPSULE | Freq: Four times a day (QID) | ORAL | Status: DC

## 2016-06-14 NOTE — Patient Instructions (Signed)
Suspect   Dental based infection .   Add antibiotic as planned   .  Doubt she  is allergic .  But slight chance of cross reaction of pcn.  See dentist  asap  And primary doc as needed also .  In 3-4 days

## 2016-06-14 NOTE — Progress Notes (Signed)
Pre visit review using our clinic review tool, if applicable. No additional management support is needed unless otherwise documented below in the visit note. 

## 2016-06-14 NOTE — Telephone Encounter (Signed)
Spoke to son (name noted on DPR) about mother's appointment today at 3:00pm with Dr. Regis Bill. Son says hospice nurse noted patient to have low-grade fever, roughly 99.6-99.7 this morning and inflammation around jaw with pain. Patient recently had two lower teeth extracted, unsure if that is related to the jaw pain. Hygiene is focus of concern for hospice care and primary care team as patient's dementia has advanced over time. All information was provided by son which was provided by hospice care.

## 2016-06-14 NOTE — Progress Notes (Signed)
Chief Complaint  Patient presents with  . Golf Ball Size Swelling Rt Jaw    Started this morning.  Has lost 2 teeth in the front but not do to extraction by dentist.  Complaining of pain.    HPI: Becky Adkins 82 y.o.  Comes in today for acute sda visit  pcp NA Here with son today.  She is in hospice care for dementia and decline  Here with son today .   Family in hospice nurse both noted swelling and tenderness on the right side of the face jaw area today. Contacted health care team and appointment was made.  Has had poor dental hygiene  Seen dentist 2 weeks ago for ? Cleaning   Hx of cough after choking a month ago and x ray ok .   Having low grade tesmps  And this am noted  larege swelling right jaw with tenderness   No traum a vomiting  Increase cough .   No specific falling or other major change in status reported.   ROS: See pertinent positives and negatives per HPI. No shaking chills dyspnea obvious respiratory distress. She swallows pills fine.  Past Medical History  Diagnosis Date  . Hypertension   . Hyperlipidemia   . Polymyalgia rheumatica (HCC)     on prednisone, has seen neuroophthalmologist, had normal temporal artery biopsy at dx  . History of GI bleed 12/2008    w/ submucosal gastric ulcers  . Dementia     mild, significant short term memory loss, eval by memory clinic and didn't think needed further meds  . Hiatal hernia   . Personal history of TIA (transient ischemic attack) 2009    neurology w/u with normal CT head, echo, carotid US  . Anemia     s/p hematology workup, h/o B12 shots  . Macular degeneration     and cataracts  . Deafness     right ear (mumps)  . GERD (gastroesophageal reflux disease)     severe on EGD with HH, H pylori neg  . Hypothyroid     s/p thyroidectomy  . Osteoporosis     was on boniva  . 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 Migraines     none recently  . Arthritis   .  Depression   . Urinary incontinence   . CAD (coronary artery disease)     cath 12/11 at [facility]: severe 3-v CAD including LM 80% LAD 70% LCX 90% RCA 50-60%, decided medical treatment  . DNR (do not resuscitate) 12/2012    MOST form filled 12/2012, ok for limited interventions, ok for transfer to ER but try to avoid ICU  . IgA monoclonal gammopathy 2016    IgA kappa gammopathy  . Chronic kidney disease (CKD), stage III (moderate) 08/13/2011    Since 2011.  No Mspike on SPEP 2013     Family History  Problem Relation Age of Onset  . Cancer Mother     cervical  . Coronary artery disease Father   . Alcohol abuse Father   . Diabetes Paternal Aunt     Social History   Social History  . Marital Status: Widowed    Spouse Name: N/A  . Number of Children: N/A  . Years of Education: N/A   Occupational History  . retired     Marine scientist   Social History Main Topics  . Smoking status: Never Smoker   . Smokeless tobacco: Never  Used  . Alcohol Use: No  . Drug Use: No  . Sexual Activity: Not Asked   Other Topics Concern  . None   Social History Narrative   Caffeine: 3 cups/day   Lives in [place] in [place]   Son lives in [place]   Daughter in Sports coach, Diane, Therapist, sports at [place]: good diet, cardiac diet prescribed    Outpatient Prescriptions Prior to Visit  Medication Sig Dispense Refill  . aspirin 81 MG chewable tablet Chew 81 mg by mouth daily.    . benzonatate (TESSALON) 100 MG capsule Take 1 capsule (100 mg total) by mouth at bedtime. And otherwise tid as needed    . citalopram (CELEXA) 20 MG tablet TAKE 1 & 1/2 TABLETS BY MOUTH IN THE MORNING. (DEPRESSION) 45 tablet 6  . cyanocobalamin 500 MCG tablet Take 500 mcg by mouth daily.    . ferrous fumarate (HEMOCYTE - 106 MG FE) 325 (106 Fe) MG TABS tablet Take 1 tablet by mouth daily.    . fluticasone (FLONASE) 50 MCG/ACT nasal spray Place 2 sprays into both nostrils daily. 16 g 3  . isosorbide mononitrate (IMDUR) 30 MG 24 hr  tablet TAKE ONE TABLET BY MOUTH TWICE DAILY FOR HEART. 60 tablet 6  . levothyroxine (SYNTHROID, LEVOTHROID) 75 MCG tablet Take 1 tablet (75 mcg total) by mouth daily before breakfast. 30 tablet 11  . loratadine (CLARITIN) 5 MG chewable tablet 5 mg a day for 1 week, increase to 10mg  a day if needed thereafter for runny nose. 60 tablet 12  . Multiple Vitamins-Minerals (PRESERVISION AREDS) TABS TAKE ONE TABLET BY MOUTH ONCE DAILY IN THE AM FOR SUPPLEMENT 30 tablet 6  . nitroGLYCERIN (NITROSTAT) 0.4 MG SL tablet Place 0.4 mg under the tongue every 5 (five) minutes as needed.      . pantoprazole (PROTONIX) 40 MG tablet TAKE 1 TABLET BY MOUTH EACH MORNING. (GERD) **DO NOT CRUSH** 30 tablet 11  . ranolazine (RANEXA) 500 MG 12 hr tablet Take 1 tablet (500 mg total) by mouth 2 (two) times daily. 60 tablet 6  . sennosides-docusate sodium (SENOKOT-S) 8.6-50 MG tablet Take 2 tablets by mouth 2 (two) times daily.    . traZODone (DESYREL) 50 MG tablet TAKE 1/2 TABLET BY MOUTH EACH NIGHT AT BEDTIME. (SLEEP/DEPRESSION) 15 tablet 11  . alum & mag hydroxide-simeth (MAALOX/MYLANTA) 200-200-20 MG/5ML suspension Take 5 mLs by mouth every 6 (six) hours as needed for indigestion or heartburn.    . doxycycline (VIBRA-TABS) 100 MG tablet Take 1 tablet (100 mg total) by mouth 2 (two) times daily. for 7 days 14 tablet 0   No facility-administered medications prior to visit.     EXAM:  BP 136/76 mmHg  Temp(Src) 98.4 F (36.9 C) (Oral)  Ht 5\' 1"  (1.549 m)  Wt 104 lb 6.4 oz (47.356 kg)  BMI 19.74 kg/m2  Body mass index is 19.74 kg/(m^2).  GENERAL: vitals reviewed and listed above, alert, Cooperative but not oriented diffuse speech, appears well hydrated and in no acute distress she has obvious swelling right jaw submandibular area. HEENT: atraumatic, conjunctiva  clear, no obvious abnormalities on inspection of external nose and ears OP : no lesion edema or exudate  teeth lower front teeth missing. Some redness no pus  or abscess obvious. Has a mild perleche  NECK: no obvious masses on inspection palpation  LUNGS: clear to auscultation bilaterally, no wheezes, rales or rhonchi, good air movement CV: HRRR, no clubbing cyanosis or  peripheral edema nl cap refill  MS: moves all extremities without noticeable focal  Abnormality ambulatory with walker .  PSYCH: pleasant and cooperative, no obvious depression or anxiety  ASSESSMENT AND PLAN:  Discussed the following assessment and plan:  Jaw swelling - adenopathy vs other salivary  tender  prob related to dental disease  Low grade fever  Dental caries  Severe dementia  Past history of allergy to penicillin-type antibiotic - mild rash with amox. Empiric antibiotic treatment see dentist tomorrow. Also follow-up with PCP or  Earlier if worse . Son understands  .  consider clinda if need to change   -Patient advised to return or notify health care team  if symptoms worsen ,persist or new concerns arise.  Patient Instructions  Suspect   Dental based infection .   Add antibiotic as planned   .  Doubt she  is allergic .  But slight chance of cross reaction of pcn.  See dentist  asap  And primary doc as needed also .  In 3-4 days      Standley Brooking. Latresa Gasser M.D.

## 2016-06-15 ENCOUNTER — Ambulatory Visit: Payer: Medicare Other | Admitting: Internal Medicine

## 2016-06-26 DIAGNOSIS — E039 Hypothyroidism, unspecified: Secondary | ICD-10-CM | POA: Diagnosis not present

## 2016-06-26 DIAGNOSIS — D538 Other specified nutritional anemias: Secondary | ICD-10-CM | POA: Diagnosis not present

## 2016-06-26 DIAGNOSIS — I129 Hypertensive chronic kidney disease with stage 1 through stage 4 chronic kidney disease, or unspecified chronic kidney disease: Secondary | ICD-10-CM | POA: Diagnosis not present

## 2016-06-26 DIAGNOSIS — F0390 Unspecified dementia without behavioral disturbance: Secondary | ICD-10-CM | POA: Diagnosis not present

## 2016-06-26 DIAGNOSIS — N183 Chronic kidney disease, stage 3 (moderate): Secondary | ICD-10-CM | POA: Diagnosis not present

## 2016-06-26 DIAGNOSIS — R634 Abnormal weight loss: Secondary | ICD-10-CM | POA: Diagnosis not present

## 2016-06-26 DIAGNOSIS — K219 Gastro-esophageal reflux disease without esophagitis: Secondary | ICD-10-CM | POA: Diagnosis not present

## 2016-06-26 DIAGNOSIS — M353 Polymyalgia rheumatica: Secondary | ICD-10-CM | POA: Diagnosis not present

## 2016-06-26 DIAGNOSIS — F331 Major depressive disorder, recurrent, moderate: Secondary | ICD-10-CM | POA: Diagnosis not present

## 2016-06-26 DIAGNOSIS — M810 Age-related osteoporosis without current pathological fracture: Secondary | ICD-10-CM | POA: Diagnosis not present

## 2016-06-26 DIAGNOSIS — D631 Anemia in chronic kidney disease: Secondary | ICD-10-CM | POA: Diagnosis not present

## 2016-06-26 DIAGNOSIS — D472 Monoclonal gammopathy: Secondary | ICD-10-CM | POA: Diagnosis not present

## 2016-06-26 DIAGNOSIS — I209 Angina pectoris, unspecified: Secondary | ICD-10-CM | POA: Diagnosis not present

## 2016-06-26 DIAGNOSIS — E785 Hyperlipidemia, unspecified: Secondary | ICD-10-CM | POA: Diagnosis not present

## 2016-06-26 DIAGNOSIS — Z8719 Personal history of other diseases of the digestive system: Secondary | ICD-10-CM | POA: Diagnosis not present

## 2016-06-26 DIAGNOSIS — I25118 Atherosclerotic heart disease of native coronary artery with other forms of angina pectoris: Secondary | ICD-10-CM | POA: Diagnosis not present

## 2016-07-04 ENCOUNTER — Other Ambulatory Visit: Payer: Self-pay | Admitting: Family Medicine

## 2016-07-04 DIAGNOSIS — F03C0 Unspecified dementia, severe, without behavioral disturbance, psychotic disturbance, mood disturbance, and anxiety: Secondary | ICD-10-CM

## 2016-07-04 DIAGNOSIS — F0390 Unspecified dementia without behavioral disturbance: Secondary | ICD-10-CM

## 2016-07-27 DIAGNOSIS — D472 Monoclonal gammopathy: Secondary | ICD-10-CM | POA: Diagnosis not present

## 2016-07-27 DIAGNOSIS — M810 Age-related osteoporosis without current pathological fracture: Secondary | ICD-10-CM | POA: Diagnosis not present

## 2016-07-27 DIAGNOSIS — F0390 Unspecified dementia without behavioral disturbance: Secondary | ICD-10-CM | POA: Diagnosis not present

## 2016-07-27 DIAGNOSIS — N183 Chronic kidney disease, stage 3 (moderate): Secondary | ICD-10-CM | POA: Diagnosis not present

## 2016-07-27 DIAGNOSIS — R634 Abnormal weight loss: Secondary | ICD-10-CM | POA: Diagnosis not present

## 2016-07-27 DIAGNOSIS — E785 Hyperlipidemia, unspecified: Secondary | ICD-10-CM | POA: Diagnosis not present

## 2016-07-27 DIAGNOSIS — I209 Angina pectoris, unspecified: Secondary | ICD-10-CM | POA: Diagnosis not present

## 2016-07-27 DIAGNOSIS — I129 Hypertensive chronic kidney disease with stage 1 through stage 4 chronic kidney disease, or unspecified chronic kidney disease: Secondary | ICD-10-CM | POA: Diagnosis not present

## 2016-07-27 DIAGNOSIS — M353 Polymyalgia rheumatica: Secondary | ICD-10-CM | POA: Diagnosis not present

## 2016-07-27 DIAGNOSIS — D631 Anemia in chronic kidney disease: Secondary | ICD-10-CM | POA: Diagnosis not present

## 2016-07-27 DIAGNOSIS — E039 Hypothyroidism, unspecified: Secondary | ICD-10-CM | POA: Diagnosis not present

## 2016-07-27 DIAGNOSIS — F331 Major depressive disorder, recurrent, moderate: Secondary | ICD-10-CM | POA: Diagnosis not present

## 2016-07-27 DIAGNOSIS — Z8719 Personal history of other diseases of the digestive system: Secondary | ICD-10-CM | POA: Diagnosis not present

## 2016-07-27 DIAGNOSIS — I25118 Atherosclerotic heart disease of native coronary artery with other forms of angina pectoris: Secondary | ICD-10-CM | POA: Diagnosis not present

## 2016-07-27 DIAGNOSIS — K219 Gastro-esophageal reflux disease without esophagitis: Secondary | ICD-10-CM | POA: Diagnosis not present

## 2016-07-27 DIAGNOSIS — D538 Other specified nutritional anemias: Secondary | ICD-10-CM | POA: Diagnosis not present

## 2016-07-30 ENCOUNTER — Telehealth: Payer: Self-pay

## 2016-07-30 ENCOUNTER — Telehealth: Payer: Self-pay | Admitting: Family Medicine

## 2016-07-30 NOTE — Telephone Encounter (Signed)
Becky Adkins with [facility] left v/m; Hospice has faxed over hospice recertification(4 pages), plan of treatment and physician interim order report on 07/06/16,07/16/16 and 07/27/16. Have not received signed paper back from Dr Darnell Level. Request cb with status of signing.

## 2016-07-30 NOTE — Telephone Encounter (Signed)
Stacia with [facility] advised.

## 2016-07-30 NOTE — Telephone Encounter (Signed)
[facility] with [facility]  left v/m requesting verbal order for urinalysis and C&S. Pt has increased confusion.Please advise.

## 2016-07-30 NOTE — Telephone Encounter (Signed)
plz give verbal order for urinalysis and culture if abnormal.

## 2016-08-01 NOTE — Telephone Encounter (Signed)
Signed and in Kim's box. 

## 2016-08-06 NOTE — Telephone Encounter (Signed)
Per chart-faxed by [facility].

## 2016-08-11 ENCOUNTER — Telehealth: Payer: Self-pay

## 2016-08-11 MED ORDER — TRAMADOL HCL 50 MG PO TABS: 25.0000 mg | ORAL_TABLET | Freq: Two times a day (BID) | ORAL | 0 refills | Status: DC | PRN

## 2016-08-11 MED ORDER — SULFAMETHOXAZOLE-TRIMETHOPRIM 800-160 MG PO TABS: 1.0000 | ORAL_TABLET | Freq: Two times a day (BID) | ORAL | 0 refills | Status: DC

## 2016-08-11 NOTE — Telephone Encounter (Signed)
[name] from Hospice called to request a prescription for Tramadol 50mg  take 1/2 a tab BID for pain. She said she faxed a positive ua cx results and is requesting a prescription called into pt's pharmacy. Thanks. [id number].

## 2016-08-11 NOTE — Telephone Encounter (Signed)
Rx [facility]ed to [facility].  

## 2016-08-11 NOTE — Telephone Encounter (Signed)
Printed Rx for tramadol and in Kim's box.  Sent in bactrim 5d abx for UTI.  Lab Results  Component Value Date   CREATININE 1.32 (H) 12/31/2015

## 2016-08-17 ENCOUNTER — Telehealth: Payer: Self-pay | Admitting: Family Medicine

## 2016-08-17 NOTE — Telephone Encounter (Signed)
Becky Adkins states that pt has developed some side effects to the tramadol, shakiness, slurred speech and she's sleeping more. Does she need to come in or just discontinue the tramadol?

## 2016-08-17 NOTE — Telephone Encounter (Signed)
[name] @ [facility] called wanting to talk to you about pt med

## 2016-08-17 NOTE — Telephone Encounter (Signed)
Becky Adkins was notified she said a form was faxed so Dr. Darnell Level can sign it, I haven't received it.

## 2016-08-17 NOTE — Telephone Encounter (Signed)
Let's stop tramadol.

## 2016-08-18 NOTE — Telephone Encounter (Signed)
Will forward to Dr. Darnell Level once form is received.

## 2016-08-23 ENCOUNTER — Telehealth: Payer: Self-pay

## 2016-08-23 MED ORDER — ISOSORBIDE MONONITRATE ER 30 MG PO TB24: 30.0000 mg | ORAL_TABLET | Freq: Every day | ORAL | 5 refills | Status: DC

## 2016-08-23 MED ORDER — IPRATROPIUM-ALBUTEROL 0.5-2.5 (3) MG/3ML IN SOLN: 3.0000 mL | Freq: Two times a day (BID) | RESPIRATORY_TRACT | 0 refills | Status: AC

## 2016-08-23 MED ORDER — CLINDAMYCIN HCL 300 MG PO CAPS: 300.0000 mg | ORAL_CAPSULE | Freq: Three times a day (TID) | ORAL | 0 refills | Status: DC

## 2016-08-23 MED ORDER — ISOSORBIDE MONONITRATE ER 30 MG PO TB24: 30.0000 mg | ORAL_TABLET | Freq: Two times a day (BID) | ORAL | 5 refills | Status: DC

## 2016-08-23 NOTE — Telephone Encounter (Signed)
Ok to give duoneb order.  Monitor for fever, tachycardia, or worsening productive cough.  Recommend decrease imdur to 1 tablet daily.

## 2016-08-23 NOTE — Telephone Encounter (Signed)
[name] chester with [facility] left v/m; pt had episode of aspiration this morning; was able to cough and clear; now some congestion; [name] request order for Duoneb for neb one bid for 3 days and then bid prn for cough and congestion. BP on 08/20/16 and today running low; today BP 98/56, would Dr Darnell Level decrease ImDur or Ranexa; pt has cardiology f/u on 08/26/16. [name] request cb.

## 2016-08-23 NOTE — Telephone Encounter (Signed)
Becky Adkins notified as instructed and voiced understanding. Since lunch pt has vomited x 3 since lunch; pt had BM and felt a little better; Pt began with CP; pale in color; BP 90/52; started Oxygen at 2 L and pt was more alert. Temp is 100. pts family wants to start abx for aspirated pneumonia. Becky Adkins said Becky Adkins 300 mg tid for 7 days # 21. Watch for developing diarrhea; update Becky G in 2 days; sooner if needed. Becky Adkins wants to know since pt having CP does Becky Adkins want to decrease Imdur; Becky Adkins said at this time leave at bid. Becky Adkins will call Duoneb and [facility]y [facility]kins to [facility].(med list updated)  FYI sent to Becky Adkins.

## 2016-08-25 ENCOUNTER — Telehealth: Payer: Self-pay | Admitting: Cardiovascular Disease

## 2016-08-25 NOTE — Telephone Encounter (Signed)
Spoke w/ Levander Campion.  She reports that pt is under Hospice care for dementia. She is being treated w/ abx & O2 for aspiration pneumonia. Dianne cancelled pt's ov tomorrow 2/2 difficulty transporting pt.  Pt's BP has been low recently, 110s/70s. She would like to know if she should resched this appt or if Dr. Rockey Situ can make any suggestions over the phone.  Advised her that this BP reading is not abnormal, requested more readings. She would like to now how involved Dr. Rockey Situ will be in pt's Hospice care. Advised her that we are not usually consulted for this, that Hospice will guide decision making & care at this point.  Advised her that if she would feel more comfortable w/ Dr. Rockey Situ making a suggestion or if she would like to make an appt to come in to speak w/ him herself, I would be more than happy to help facilitate this.  She is appreciative of the call and for any prayers. Asked her to call back if we can be of any assistance.

## 2016-08-25 NOTE — Telephone Encounter (Signed)
Patient daughter Becky Adkins cancelled tomorrows appt .  Patient is at [facility] care facility under hospice care and has aspirated .  Patient is ok but weak and not able to come for appt.  They wanted to go over medication questions and some angina concerns at this appt .  Becky Adkins would like to know if they should r/s.  Please call.

## 2016-08-25 NOTE — Telephone Encounter (Signed)
Left message for Diane to call back

## 2016-08-26 ENCOUNTER — Ambulatory Visit: Payer: Medicare Other | Admitting: Cardiovascular Disease

## 2016-08-27 DIAGNOSIS — N183 Chronic kidney disease, stage 3 (moderate): Secondary | ICD-10-CM | POA: Diagnosis not present

## 2016-08-27 DIAGNOSIS — M353 Polymyalgia rheumatica: Secondary | ICD-10-CM | POA: Diagnosis not present

## 2016-08-27 DIAGNOSIS — F0390 Unspecified dementia without behavioral disturbance: Secondary | ICD-10-CM | POA: Diagnosis not present

## 2016-08-27 DIAGNOSIS — M810 Age-related osteoporosis without current pathological fracture: Secondary | ICD-10-CM | POA: Diagnosis not present

## 2016-08-27 DIAGNOSIS — D472 Monoclonal gammopathy: Secondary | ICD-10-CM | POA: Diagnosis not present

## 2016-08-27 DIAGNOSIS — I209 Angina pectoris, unspecified: Secondary | ICD-10-CM | POA: Diagnosis not present

## 2016-08-27 DIAGNOSIS — I25118 Atherosclerotic heart disease of native coronary artery with other forms of angina pectoris: Secondary | ICD-10-CM | POA: Diagnosis not present

## 2016-08-27 DIAGNOSIS — R634 Abnormal weight loss: Secondary | ICD-10-CM | POA: Diagnosis not present

## 2016-08-27 DIAGNOSIS — K219 Gastro-esophageal reflux disease without esophagitis: Secondary | ICD-10-CM | POA: Diagnosis not present

## 2016-08-27 DIAGNOSIS — Z8719 Personal history of other diseases of the digestive system: Secondary | ICD-10-CM | POA: Diagnosis not present

## 2016-08-27 DIAGNOSIS — D538 Other specified nutritional anemias: Secondary | ICD-10-CM | POA: Diagnosis not present

## 2016-08-27 DIAGNOSIS — D631 Anemia in chronic kidney disease: Secondary | ICD-10-CM | POA: Diagnosis not present

## 2016-08-27 DIAGNOSIS — F331 Major depressive disorder, recurrent, moderate: Secondary | ICD-10-CM | POA: Diagnosis not present

## 2016-08-27 DIAGNOSIS — I129 Hypertensive chronic kidney disease with stage 1 through stage 4 chronic kidney disease, or unspecified chronic kidney disease: Secondary | ICD-10-CM | POA: Diagnosis not present

## 2016-08-27 DIAGNOSIS — E039 Hypothyroidism, unspecified: Secondary | ICD-10-CM | POA: Diagnosis not present

## 2016-08-27 DIAGNOSIS — E785 Hyperlipidemia, unspecified: Secondary | ICD-10-CM | POA: Diagnosis not present

## 2016-09-08 IMAGING — CR DG CHEST 2V
2 series · 2 of 2 positions shown · non-contrast
Comparison: [DATE] [DATE], [DATE], [DATE] [DATE], [DATE]

CLINICAL DATA: Productive cough starting this morning with chest
pain.

EXAM:
CHEST  2 VIEW

[chest lat]
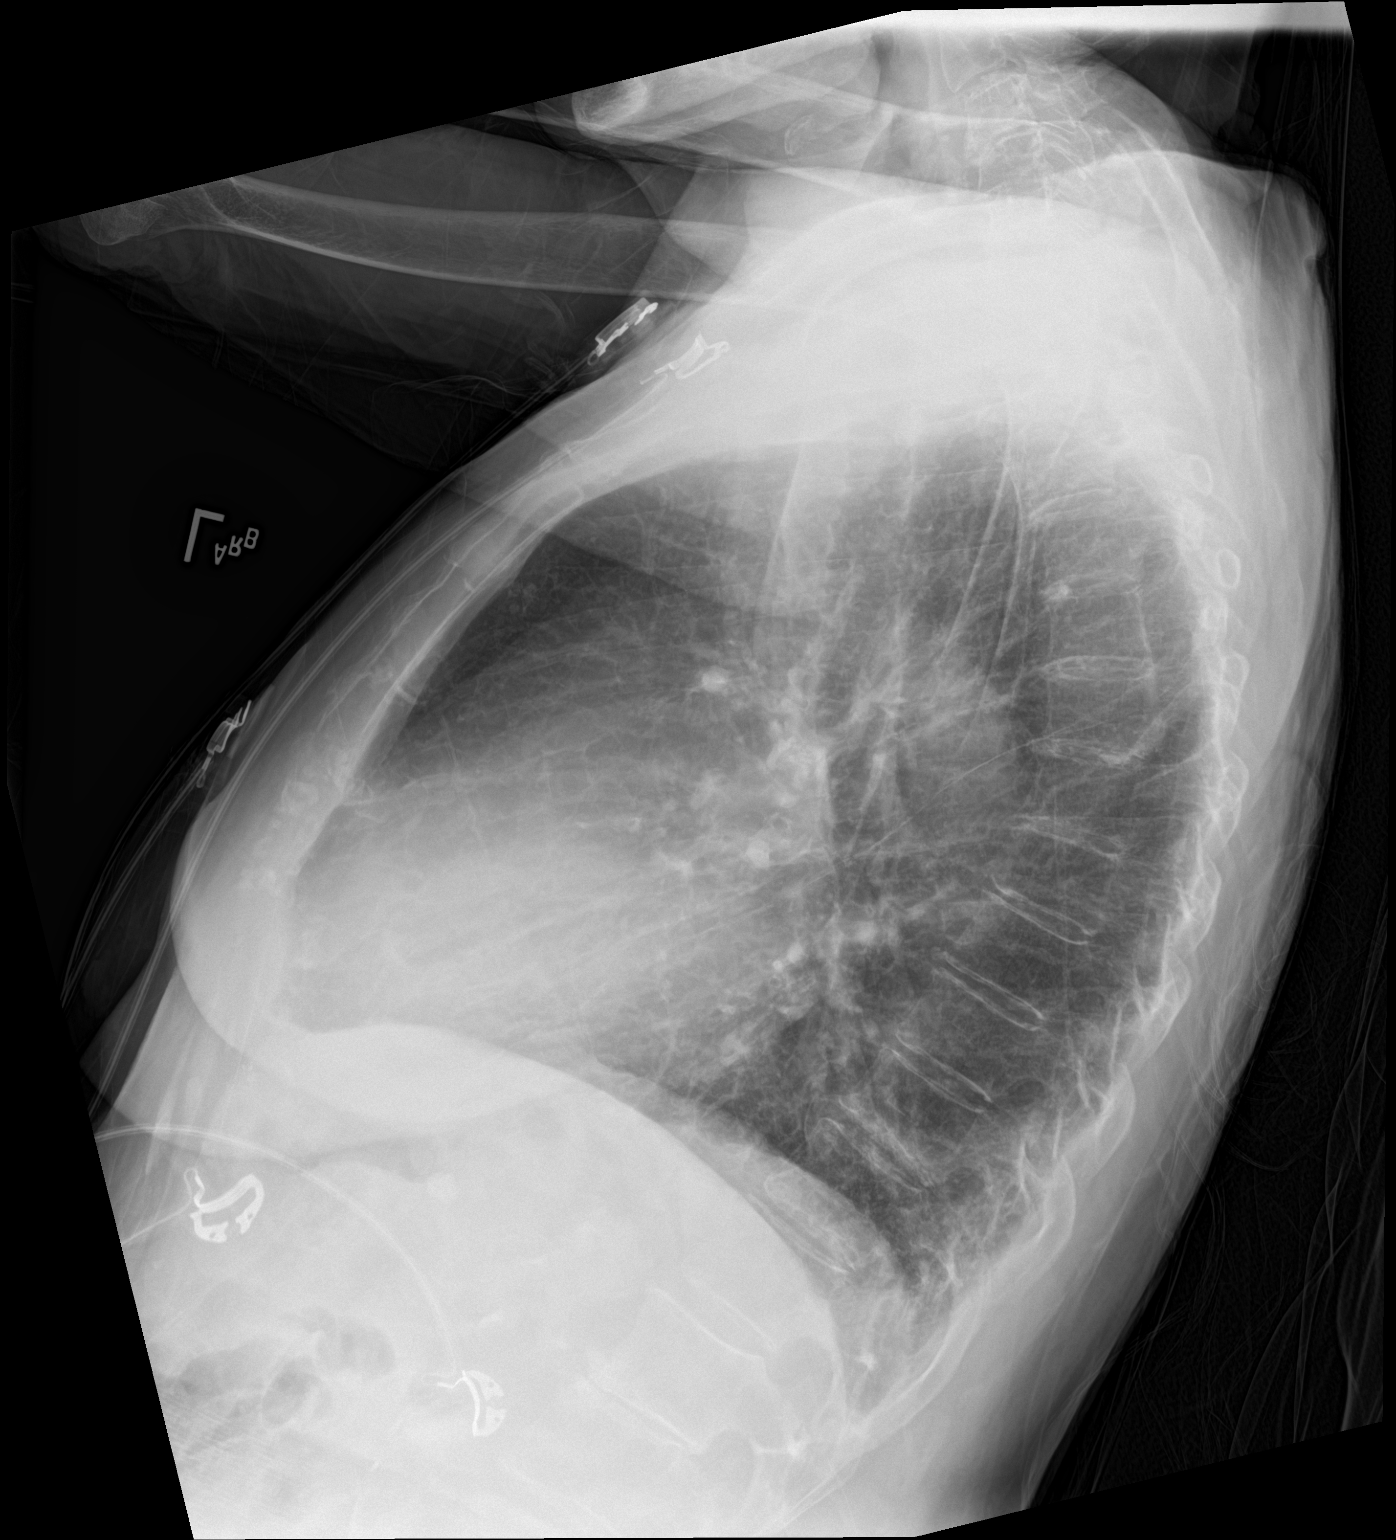

[chest ap]
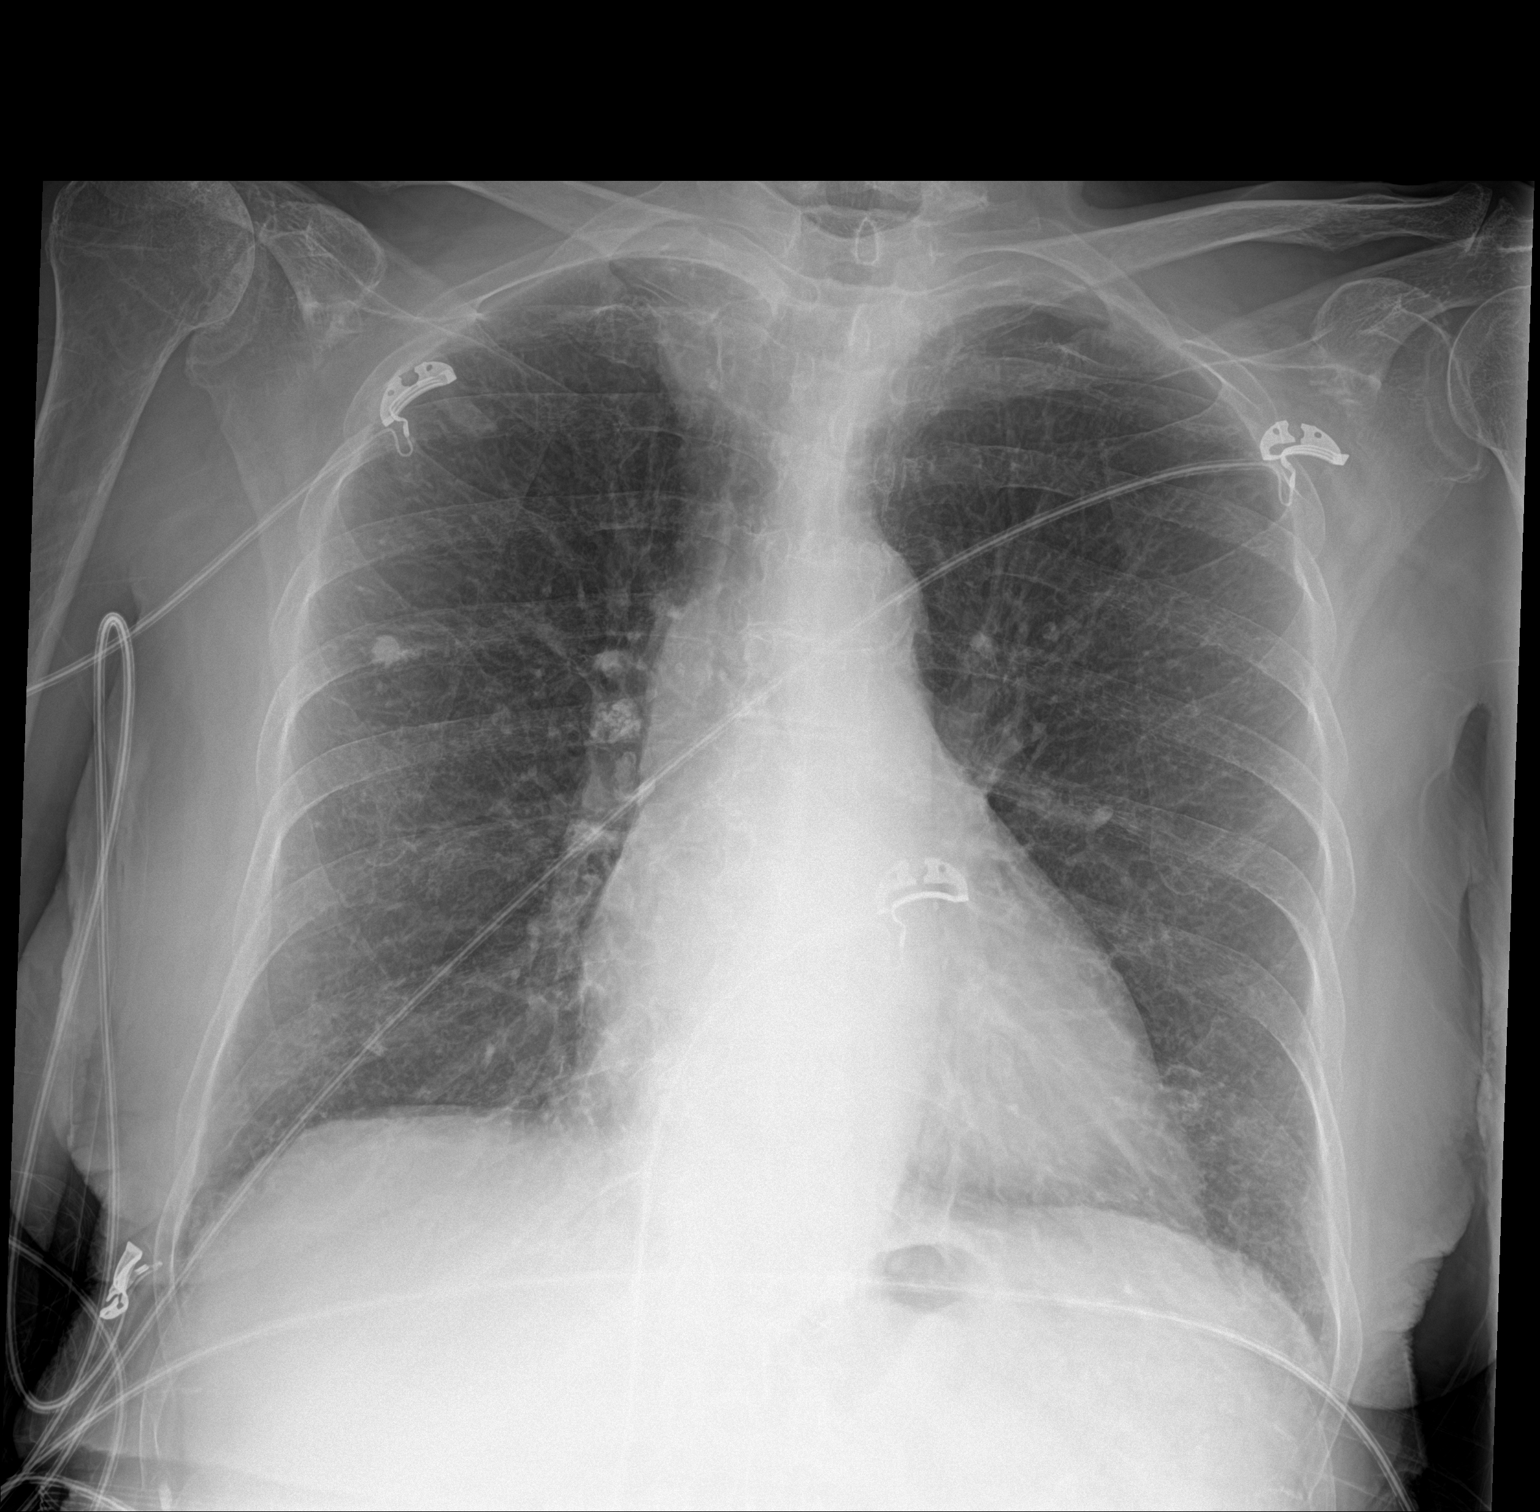

[2 of 2 positions shown; findings below may reference images not displayed]

FINDINGS: The heart size and mediastinal contours are within normal limits.
There is a stable calcified granuloma in the right upper lobe. There
is no focal infiltrate, pulmonary edema, or pleural effusion. The
visualized skeletal structures are stable P
IMPRESSION: No active cardiopulmonary disease. Stable calcified granuloma in the
right lung.

## 2016-09-14 ENCOUNTER — Telehealth: Payer: Self-pay

## 2016-09-14 NOTE — Telephone Encounter (Signed)
[name] with [facility] left v/m; over last weekend pt started with fever 100.1 and on 09/13/16 fever was 99.2, today fever was 100. [name] request verbal order for U/A and C&S. [name] request cb.

## 2016-09-14 NOTE — Telephone Encounter (Signed)
Spoke to Becky Adkins. She said she is having a cough. Does not hear anything in her lungs. She is wearing her oxygen and when she takes it off her O2 Sats go down. She is really declining. Sleeping more. Less of an appetite. BP has been 92/58 average. No more chest pain.

## 2016-09-14 NOTE — Telephone Encounter (Signed)
Any other sxs? Cough, dysuria? Ok to do urinalysis and culture/sensitivity if abnormal. thanks.

## 2016-09-15 NOTE — Telephone Encounter (Signed)
Will await UA/UCx. If no chest pain, suggest decrease imdur to 30mg  once daily.   Recent bactrim (UTI) then clindamycin course (asp PNA).

## 2016-09-16 NOTE — Telephone Encounter (Signed)
I called and spoke with [name], she states the pt hasn't had any chest pain since the episode a couple of weeks ago. They will decrease Imdur to 30mg  daily. She also says to expect a fax for signed order for the verbal UA/UC.

## 2016-09-20 MED ORDER — SULFAMETHOXAZOLE-TRIMETHOPRIM 800-160 MG PO TABS: 1.0000 | ORAL_TABLET | Freq: Two times a day (BID) | ORAL | 0 refills | Status: DC

## 2016-09-20 MED ORDER — SULFAMETHOXAZOLE-TRIMETHOPRIM 400-80 MG PO TABS: 1.0000 | ORAL_TABLET | Freq: Two times a day (BID) | ORAL | 0 refills | Status: DC

## 2016-09-20 NOTE — Telephone Encounter (Signed)
plz notify - I received preliminary urine culture with >100k E coli. rec start bactrim 400/80 one BID while we await sensitivities. Sent to pharmacy.

## 2016-09-20 NOTE — Addendum Note (Signed)
Addended by: Ria Bush on: 09/20/2016 06:13 PM   Modules accepted: Orders

## 2016-09-21 ENCOUNTER — Other Ambulatory Visit: Payer: Self-pay | Admitting: Family Medicine

## 2016-09-21 MED ORDER — CIPROFLOXACIN HCL 250 MG PO TABS: 250.0000 mg | ORAL_TABLET | Freq: Two times a day (BID) | ORAL | 0 refills | Status: DC

## 2016-09-21 NOTE — Addendum Note (Signed)
Addended by: Tonia Ghent on: 09/21/2016 11:46 AM   Modules accepted: Orders

## 2016-09-21 NOTE — Telephone Encounter (Signed)
D/c septra, change to cipro.  rx sent.  Thanks.

## 2016-09-21 NOTE — Telephone Encounter (Signed)
Results given to Dr. Damita Dunnings in Dr. Synthia Innocent absence.

## 2016-09-21 NOTE — Telephone Encounter (Signed)
Message left advising Becky Adkins.

## 2016-09-21 NOTE — Telephone Encounter (Signed)
Message left notifying [name].

## 2016-09-21 NOTE — Telephone Encounter (Signed)
Spoke with [employer]. Final results came back and shows resistance to Bactrim. She is sending results to me. Advised to hold off on starting the bactrim and we will send something else in this afternoon.

## 2016-09-26 DIAGNOSIS — F331 Major depressive disorder, recurrent, moderate: Secondary | ICD-10-CM | POA: Diagnosis not present

## 2016-09-26 DIAGNOSIS — E039 Hypothyroidism, unspecified: Secondary | ICD-10-CM | POA: Diagnosis not present

## 2016-09-26 DIAGNOSIS — I25118 Atherosclerotic heart disease of native coronary artery with other forms of angina pectoris: Secondary | ICD-10-CM | POA: Diagnosis not present

## 2016-09-26 DIAGNOSIS — D631 Anemia in chronic kidney disease: Secondary | ICD-10-CM | POA: Diagnosis not present

## 2016-09-26 DIAGNOSIS — D472 Monoclonal gammopathy: Secondary | ICD-10-CM | POA: Diagnosis not present

## 2016-09-26 DIAGNOSIS — N183 Chronic kidney disease, stage 3 (moderate): Secondary | ICD-10-CM | POA: Diagnosis not present

## 2016-09-26 DIAGNOSIS — D538 Other specified nutritional anemias: Secondary | ICD-10-CM | POA: Diagnosis not present

## 2016-09-26 DIAGNOSIS — K219 Gastro-esophageal reflux disease without esophagitis: Secondary | ICD-10-CM | POA: Diagnosis not present

## 2016-09-26 DIAGNOSIS — M353 Polymyalgia rheumatica: Secondary | ICD-10-CM | POA: Diagnosis not present

## 2016-09-26 DIAGNOSIS — R634 Abnormal weight loss: Secondary | ICD-10-CM | POA: Diagnosis not present

## 2016-09-26 DIAGNOSIS — M810 Age-related osteoporosis without current pathological fracture: Secondary | ICD-10-CM | POA: Diagnosis not present

## 2016-09-26 DIAGNOSIS — F0390 Unspecified dementia without behavioral disturbance: Secondary | ICD-10-CM | POA: Diagnosis not present

## 2016-09-26 DIAGNOSIS — I209 Angina pectoris, unspecified: Secondary | ICD-10-CM | POA: Diagnosis not present

## 2016-09-26 DIAGNOSIS — I129 Hypertensive chronic kidney disease with stage 1 through stage 4 chronic kidney disease, or unspecified chronic kidney disease: Secondary | ICD-10-CM | POA: Diagnosis not present

## 2016-09-26 DIAGNOSIS — Z8719 Personal history of other diseases of the digestive system: Secondary | ICD-10-CM | POA: Diagnosis not present

## 2016-09-26 DIAGNOSIS — E785 Hyperlipidemia, unspecified: Secondary | ICD-10-CM | POA: Diagnosis not present

## 2016-10-05 ENCOUNTER — Telehealth: Payer: Self-pay

## 2016-10-05 NOTE — Telephone Encounter (Signed)
Becky Adkins from [place] said an order for Cipro was not received at [facility]; order was sent to pharmacare and [name] has the medication but pt has not received med yet until gets order faxed to [phone] to start Cipro 10/05/09.

## 2016-10-05 NOTE — Telephone Encounter (Addendum)
Spoke with Parke Simmers nurse tech, expressed my concern that patient had not started appropriate antibiotics for 2 wks after UCx returned concerning for UTI. Asked for them to review their system to ensure this doesn't happen again. She stated she would pass this on to her supervisor.   Recent bactrim (UTI) then clindamycin course (asp PNA) now starting cipro (UTI resistant to bactrim).

## 2016-10-05 NOTE — Telephone Encounter (Signed)
Order faxed.

## 2016-10-06 ENCOUNTER — Telehealth: Payer: Self-pay | Admitting: Cardiovascular Disease

## 2016-10-06 NOTE — Telephone Encounter (Signed)
Gordon from [facility] called and said when fax order to pharmacy automatically fax order to [employer]. Advised Taitym someone from [facility] would need to call and request order to be faxed to both pharmacy and [facility]. Daijha voiced understanding.

## 2016-10-06 NOTE — Telephone Encounter (Signed)
Per Son Patient is on hospice and no need to fu at this time.  Deleting recall.

## 2016-10-07 ENCOUNTER — Other Ambulatory Visit: Payer: Self-pay | Admitting: Family Medicine

## 2016-10-27 DIAGNOSIS — K219 Gastro-esophageal reflux disease without esophagitis: Secondary | ICD-10-CM | POA: Diagnosis not present

## 2016-10-27 DIAGNOSIS — E039 Hypothyroidism, unspecified: Secondary | ICD-10-CM | POA: Diagnosis not present

## 2016-10-27 DIAGNOSIS — M810 Age-related osteoporosis without current pathological fracture: Secondary | ICD-10-CM | POA: Diagnosis not present

## 2016-10-27 DIAGNOSIS — D538 Other specified nutritional anemias: Secondary | ICD-10-CM | POA: Diagnosis not present

## 2016-10-27 DIAGNOSIS — D472 Monoclonal gammopathy: Secondary | ICD-10-CM | POA: Diagnosis not present

## 2016-10-27 DIAGNOSIS — M353 Polymyalgia rheumatica: Secondary | ICD-10-CM | POA: Diagnosis not present

## 2016-10-27 DIAGNOSIS — Z8719 Personal history of other diseases of the digestive system: Secondary | ICD-10-CM | POA: Diagnosis not present

## 2016-10-27 DIAGNOSIS — D631 Anemia in chronic kidney disease: Secondary | ICD-10-CM | POA: Diagnosis not present

## 2016-10-27 DIAGNOSIS — N183 Chronic kidney disease, stage 3 (moderate): Secondary | ICD-10-CM | POA: Diagnosis not present

## 2016-10-27 DIAGNOSIS — F331 Major depressive disorder, recurrent, moderate: Secondary | ICD-10-CM | POA: Diagnosis not present

## 2016-10-27 DIAGNOSIS — I129 Hypertensive chronic kidney disease with stage 1 through stage 4 chronic kidney disease, or unspecified chronic kidney disease: Secondary | ICD-10-CM | POA: Diagnosis not present

## 2016-10-27 DIAGNOSIS — R634 Abnormal weight loss: Secondary | ICD-10-CM | POA: Diagnosis not present

## 2016-10-27 DIAGNOSIS — E785 Hyperlipidemia, unspecified: Secondary | ICD-10-CM | POA: Diagnosis not present

## 2016-10-27 DIAGNOSIS — F0390 Unspecified dementia without behavioral disturbance: Secondary | ICD-10-CM | POA: Diagnosis not present

## 2016-10-27 DIAGNOSIS — I25118 Atherosclerotic heart disease of native coronary artery with other forms of angina pectoris: Secondary | ICD-10-CM | POA: Diagnosis not present

## 2016-10-27 DIAGNOSIS — I209 Angina pectoris, unspecified: Secondary | ICD-10-CM | POA: Diagnosis not present

## 2016-10-29 IMAGING — CR DG CHEST 1V
1 series · 1 of 1 positions shown · non-contrast
Comparison: Chest x-ray of 09/08/2015

CLINICAL DATA: Chest pain today, history of smoking and shortness
of breath

EXAM:
CHEST 1 VIEW

[view not recorded]
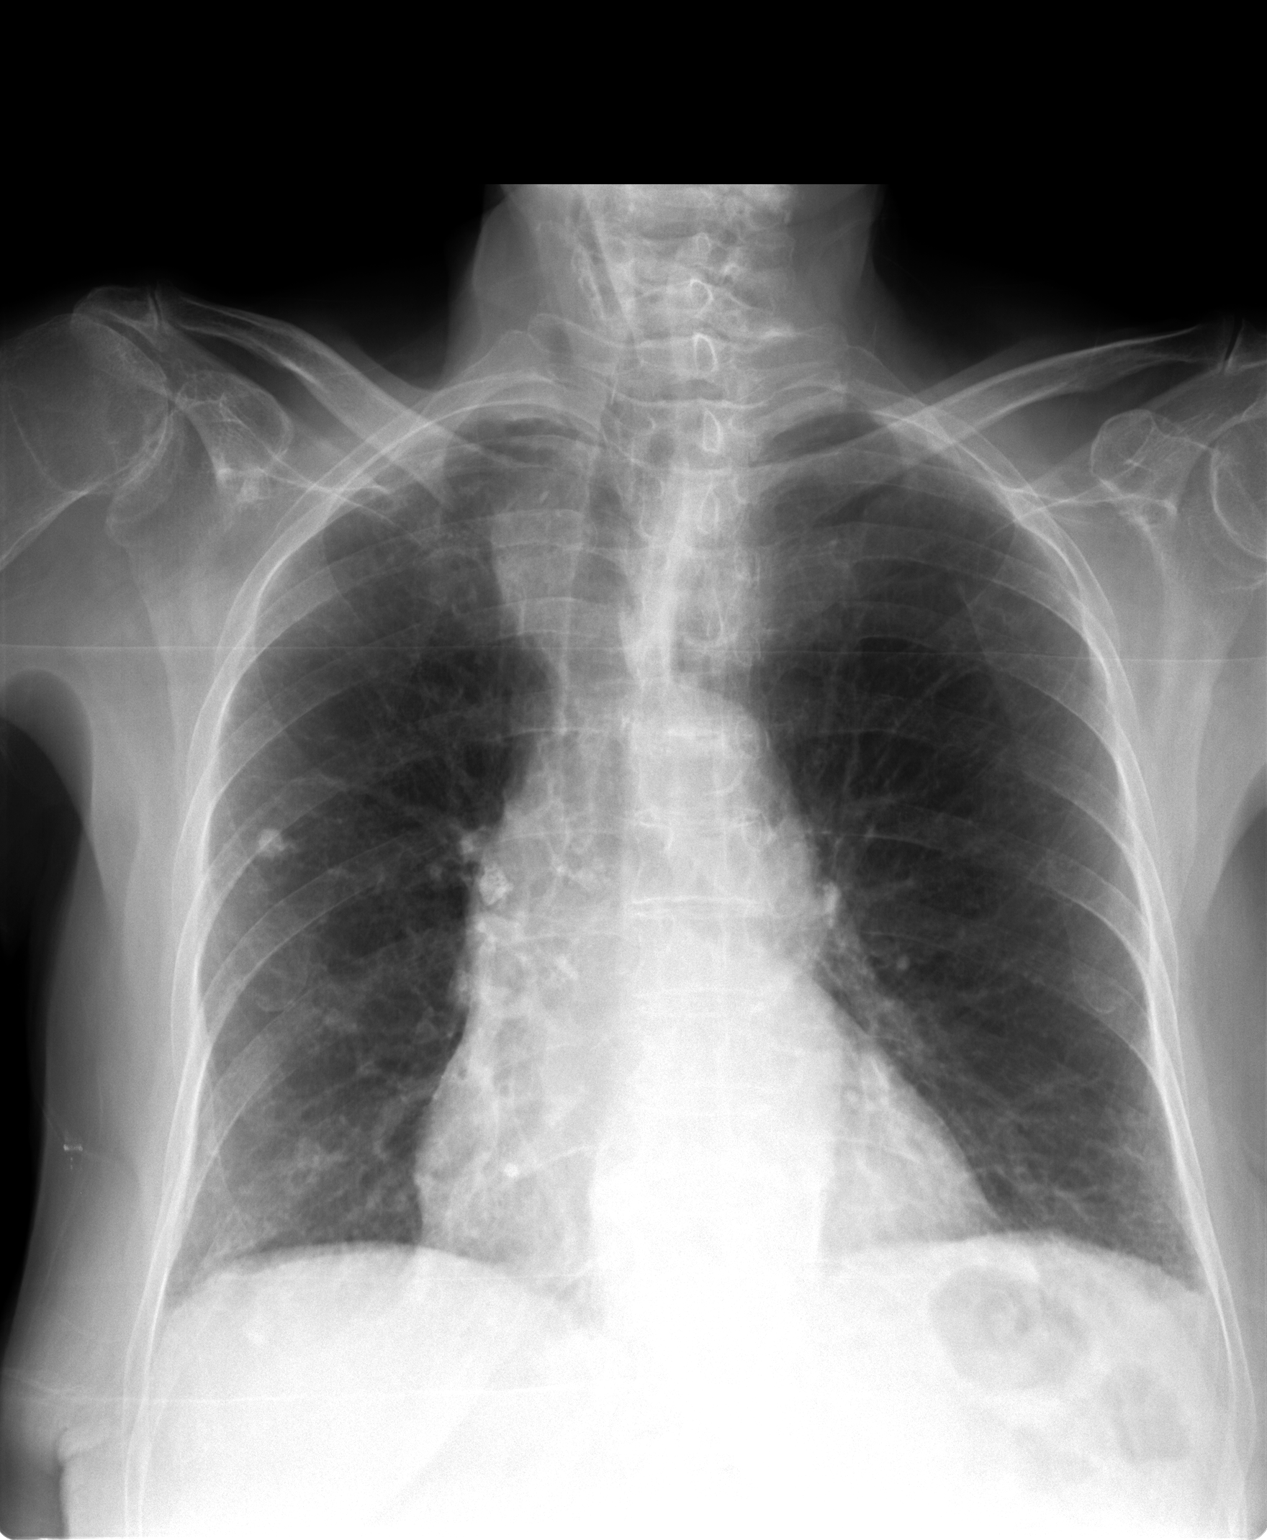

[1 of 1 positions shown; findings below may reference images not displayed]

FINDINGS: No active infiltrate or effusion is seen. The lungs appear slightly
hyperaerated. A calcified granuloma in the right midlung is
unchanged. Mediastinal and hilar contours are stable with probable a
ectatic great vessels creating prominent soft tissue in the right
paratracheal region, unchanged. The heart is mildly enlarged and
stable. There may be a small hiatal hernia present. No bony
abnormality is seen.
IMPRESSION: No active lung disease.  Slight hyper aeration.  Mild cardiomegaly.

## 2016-12-07 ENCOUNTER — Telehealth: Payer: Self-pay

## 2016-12-07 NOTE — Telephone Encounter (Signed)
Attendant at [facility] advised that order had been faxed.  Becky Adkins had left at 3 pm.

## 2016-12-07 NOTE — Telephone Encounter (Signed)
Tonya at [facility] left v/m requesting status of physician communication form that was faxed to [facility] on 12/06/16. Hospice nurse wants to get order for endit cream. Tonya request cb.

## 2016-12-07 NOTE — Telephone Encounter (Signed)
Please send.  Thanks!

## 2016-12-09 ENCOUNTER — Encounter: Payer: Self-pay | Admitting: Family Medicine

## 2016-12-09 DIAGNOSIS — L8992 Pressure ulcer of unspecified site, stage 2: Secondary | ICD-10-CM | POA: Insufficient documentation

## 2017-01-04 ENCOUNTER — Telehealth: Payer: Self-pay

## 2017-01-04 NOTE — Telephone Encounter (Signed)
[name] with [facility] left v/m that pt has had some bleeding around tooth that has cavity; pt has appt with dentist later today. Request order to hold ASA for today. [name] request cb.

## 2017-01-04 NOTE — Telephone Encounter (Signed)
Called and spoke with [name] - 30 min bleeding episode today. Dentist said teeth were in decay, but none in acute need to be pulled.  Will: Hold aspirin for 2 wks, then restart if no active bleeding.  Ok to stop preservision and b12 vitamin. Continue iron.  Check CBC.

## 2017-01-05 LAB — CBC
HGB: 7.6 g/dL
MCV: 98
PLATELET COUNT: 205
WBC: 5.8

## 2017-01-06 NOTE — Telephone Encounter (Signed)
[name] notified. 

## 2017-01-06 NOTE — Telephone Encounter (Signed)
plz notify [name] hospice RN or family - CBC returned showing plt ok at 205, however worsening of anemia with hgb 7.6.  If symptoms of worsening dyspnea or chest pain or dizziness, we can do therapeutic blood transfusion. Otherwise would just monitor symptoms.

## 2017-01-28 ENCOUNTER — Telehealth: Payer: Self-pay

## 2017-01-28 NOTE — Telephone Encounter (Signed)
Agree. I'm glad sore has improved.

## 2017-01-28 NOTE — Telephone Encounter (Signed)
Theresa at [facility] request to change Endit cream to prn; the sore on buttock has cleared up.Hospice nurse requested to make prn order incase of redness on buttock. Darian request order faxed to [phone].

## 2017-01-28 NOTE — Telephone Encounter (Signed)
No - would see if printed phone note suffices.

## 2017-01-28 NOTE — Telephone Encounter (Signed)
Do you have orders forms for Becky Adkins to send the fax to them with the order?

## 2017-02-01 NOTE — Telephone Encounter (Signed)
Order faxed.

## 2017-02-09 ENCOUNTER — Encounter: Payer: Self-pay | Admitting: *Deleted

## 2017-03-17 DIAGNOSIS — N183 Chronic kidney disease, stage 3 (moderate): Secondary | ICD-10-CM

## 2017-03-17 DIAGNOSIS — I25118 Atherosclerotic heart disease of native coronary artery with other forms of angina pectoris: Secondary | ICD-10-CM | POA: Diagnosis not present

## 2017-03-17 DIAGNOSIS — I129 Hypertensive chronic kidney disease with stage 1 through stage 4 chronic kidney disease, or unspecified chronic kidney disease: Secondary | ICD-10-CM | POA: Diagnosis not present

## 2017-03-17 DIAGNOSIS — I209 Angina pectoris, unspecified: Secondary | ICD-10-CM | POA: Diagnosis not present

## 2017-03-18 ENCOUNTER — Telehealth: Payer: Self-pay

## 2017-03-18 NOTE — Telephone Encounter (Signed)
Becky Adkins with Hospice left v/m; pt having bleeding at gum line again; pt taking ASA 81 mg daily; should pt stop ASA 81 mg or hold  and if to hold [name] wants to know how many days to hold ASA. Also pt having external  hemorrhoid pain; presently pt getting Preparation H bid to that area; can Preparation H frequency be increased; [name] request cb.

## 2017-03-18 NOTE — Telephone Encounter (Signed)
Hold aspirin for 1 week then restart if bleeding resolved May increase Prep H to TID. If not effective, let us know for anusol HC hydrocortisone Rx.

## 2017-03-18 NOTE — Telephone Encounter (Signed)
Stacey notified and verbalized understanding.

## 2017-03-22 ENCOUNTER — Telehealth: Payer: Self-pay | Admitting: *Deleted

## 2017-03-22 NOTE — Telephone Encounter (Signed)
Becky Adkins with [facility] states she sent over a fax requesting a UA with [facility]. Becky Adkins states she has not heard a response as of yet and is wanting to confirm receipt. pls advise

## 2017-03-22 NOTE — Telephone Encounter (Signed)
Order faxed back to Hospice.

## 2017-03-22 NOTE — Telephone Encounter (Signed)
Just received this now. Placed in [place]' box.

## 2017-03-30 ENCOUNTER — Telehealth: Payer: Self-pay | Admitting: Family Medicine

## 2017-03-30 NOTE — Telephone Encounter (Signed)
Left pt message asking to call Ebony Hail back directly at [phone] to schedule AWV/ CPE with PCP.  Pt does not need appt with Katha Cabal

## 2017-04-07 ENCOUNTER — Telehealth: Payer: Self-pay | Admitting: *Deleted

## 2017-04-07 NOTE — Telephone Encounter (Signed)
Spoke with Erline Levine and advised that we haven't received any results. She said she checked with both companies that they used and neither of them have any records of a specimen being sent even though the facility said it was collected. She will recollect and asked if an in and out cath was ok if she couldn't get her to go normally. I advised it was fine as long as it wasn't too traumatic for patient. She verbalized understanding. She also advised that patient seemed to be more weak than usual but using her O2 seemed to help. She couldn't get a a good pulse ox on her (which is common for patient). Her lungs were clear but she was complaining of bilateral arm pain.

## 2017-04-07 NOTE — Telephone Encounter (Signed)
I have not received any results yet of UA or culture. Would have [place] or her call to f/u results.

## 2017-04-07 NOTE — Telephone Encounter (Signed)
Becky Adkins with hospice states that an order was given for pt to have urine culture and wanting to know if the results had been received for Tx. No record of results being scanned into pts chart. Becky Adkins reports pt is very weak, but vitals are normal. pls advise

## 2017-04-08 NOTE — Telephone Encounter (Signed)
Stacey with [facility] left v/m; was not able to get in and out cath due to agitation; Erline Levine will continue to try to get urine. Pt also has wet, non prod cough and can schedule duoneb bid for 3 - 4 days. [place] request cb.

## 2017-04-08 NOTE — Telephone Encounter (Signed)
OK to schedule duoneb bid for 3 days, update [place] on Monday with effect.

## 2017-04-08 NOTE — Telephone Encounter (Signed)
[name] notified and will call Monday with an update. Also okayed for staff to monitor temp over the weekend.

## 2017-04-11 ENCOUNTER — Telehealth: Payer: Self-pay

## 2017-04-11 DIAGNOSIS — N3001 Acute cystitis with hematuria: Secondary | ICD-10-CM

## 2017-04-11 NOTE — Telephone Encounter (Signed)
Becky Adkins with [facility] left v/m; pt did not have fever over the past weekend; pt has completed duoneb; pt has wet nonproductive cough and some crackles in lower bases. Pt is not labored and seems in better spirits. Becky Adkins request cb.

## 2017-04-12 MED ORDER — SULFAMETHOXAZOLE-TRIMETHOPRIM 800-160 MG PO TABS: 1.0000 | ORAL_TABLET | Freq: Two times a day (BID) | ORAL | 0 refills | Status: DC

## 2017-04-12 NOTE — Telephone Encounter (Signed)
I'm glad pt doing better.  Would continue to monitor cough and if persistent or worsening, update [place] for treatment. I believe cough started in the past week.  Looks like we were not able to get urine sample?

## 2017-04-12 NOTE — Addendum Note (Signed)
Addended by: Ria Bush on: 04/12/2017 05:19 PM   Modules accepted: Orders

## 2017-04-12 NOTE — Telephone Encounter (Signed)
Message left advising Becky Adkins. Advised to call if urine sample was obtained so we could watch for results.

## 2017-04-12 NOTE — Assessment & Plan Note (Signed)
UA suspicious for infection - pending UCx.  Treat with keflex.

## 2017-04-12 NOTE — Telephone Encounter (Addendum)
Reviewed - blood, leukocyte esterase - concerning for infection. Pending UCx. Will recommend start bactrim 7d course sent to pharmacy

## 2017-04-12 NOTE — Telephone Encounter (Signed)
[name] returned Kim's call.  She faxed over the urine sample results.  The results are in Dr.G's In box.

## 2017-04-13 MED ORDER — SULFAMETHOXAZOLE-TRIMETHOPRIM 800-160 MG PO TABS: 1.0000 | ORAL_TABLET | Freq: Two times a day (BID) | ORAL | 0 refills | Status: AC

## 2017-04-13 NOTE — Telephone Encounter (Addendum)
Floyd notified. Resent med to new pharmacy as requested by Erline Levine.

## 2017-04-13 NOTE — Addendum Note (Signed)
Addended by: Royann Shivers A on: 04/13/2017 10:58 AM   Modules accepted: Orders

## 2017-04-14 NOTE — Telephone Encounter (Signed)
plz notify Erline Levine - urine culture returned positive for Klebsiella infection sensitive to antibiotic chosen. Update [place] if persistent symptoms after treatment.

## 2017-04-15 NOTE — Telephone Encounter (Signed)
Spoke to Becky Adkins and advised. FYIPt is doing well, but aspirated a little this morning and has a small cough. Pt is non febrile per Erline Levine.

## 2017-04-19 ENCOUNTER — Telehealth: Payer: Self-pay

## 2017-04-19 NOTE — Telephone Encounter (Signed)
[name] with [facility] left v/m; pt is at [facility]; pt having bleeding from tooth on left upper side again and wants to know if can hold ASA 81 mg.Please advise.

## 2017-04-19 NOTE — Telephone Encounter (Signed)
Yes ok to hold aspirin for 2 weeks to hopefully allow time for tooth to heal. Do cold water gargles as well if patient can do.

## 2017-04-20 NOTE — Telephone Encounter (Signed)
[name] notified and verbalized understanding.

## 2017-05-02 DIAGNOSIS — N183 Chronic kidney disease, stage 3 (moderate): Secondary | ICD-10-CM

## 2017-05-02 DIAGNOSIS — I25118 Atherosclerotic heart disease of native coronary artery with other forms of angina pectoris: Secondary | ICD-10-CM | POA: Diagnosis not present

## 2017-05-02 DIAGNOSIS — I129 Hypertensive chronic kidney disease with stage 1 through stage 4 chronic kidney disease, or unspecified chronic kidney disease: Secondary | ICD-10-CM

## 2017-05-02 DIAGNOSIS — D631 Anemia in chronic kidney disease: Secondary | ICD-10-CM

## 2017-05-09 ENCOUNTER — Telehealth: Payer: Self-pay

## 2017-05-09 NOTE — Telephone Encounter (Signed)
Becky Adkins with [facility] left v/m requesting med for pain; pt has allergies to tramadol and codeine. Becky Adkins request cb with what can give for pain.pt has pain in arms and legs. Pt is presently on no pain med.Please advise. This call came in after 5pm.

## 2017-05-10 MED ORDER — OXYCODONE HCL 5 MG PO TABS: 2.5000 mg | ORAL_TABLET | Freq: Two times a day (BID) | ORAL | 0 refills | Status: DC | PRN

## 2017-05-10 MED ORDER — OXYCODONE-ACETAMINOPHEN 2.5-325 MG PO TABS: 1.0000 | ORAL_TABLET | Freq: Two times a day (BID) | ORAL | 0 refills | Status: DC | PRN

## 2017-05-10 NOTE — Telephone Encounter (Signed)
Sherry nurse with Hospice left a voicemail stating that she is with patient and she is complaining of left side of pain during the day. Judeen Hammans stated that the case manager had left a message yesterday trying to get an order of Oxycodone 2.5  Mg BID.  Judeen Hammans stated that they are aware that she is allergic to codeine and the reaction was nausea and vomiting. Judeen Hammans stated that they are willing to try it again to see if she will do okay with it this time.  Patient is at [facility] and they would like the script faxed to [place] stated that she would like a call back at [phone]

## 2017-05-10 NOTE — Telephone Encounter (Signed)
Sherry notified Dr. Darnell Level was okay for pt to try the oxycodone, Rx faxed to fax # in Sherry's message

## 2017-05-10 NOTE — Telephone Encounter (Signed)
Becky Adkins with Hospice said pt is already taking tylenol with no improvement in pain. Becky Adkins did speak with pharmacy and they are aware of her allergy to codeine but they think if Dr. Darnell Level gives her a low dose (5mg ) and they give her 1/2 tab (2.5mg ) that it probably will not affect her to bad because the reaction she has with codeine is nausea.   Becky Adkins wanted Dr. Darnell Level to know that the pt is really declined over the weekend. Pt became really SOB even with her oxygen on, she is sleeping in her recliner because it's hard for her to breath laying down in her bed. Pt also started complaining of arm/leg pain over the last few days and now she can't really stand. She can't use her walker at all and Becky Adkins had to order her a wheelchair to move her around because she isn't walking. The family is aware of her declining health and they just want to keep her comfortable and that's why Becky Adkins is requesting Rx for pain

## 2017-05-10 NOTE — Telephone Encounter (Signed)
Noted. Ok to try oxycodone 5mg  1/2 tab BID  Printed and in CMA box.

## 2017-05-10 NOTE — Telephone Encounter (Signed)
Have we tried tylenol? Would start with scheduled tylenol 500mg  bid. If pain improves may change tylenol to PRN.

## 2017-05-13 ENCOUNTER — Ambulatory Visit
Admission: RE | Admit: 2017-05-13 | Discharge: 2017-05-13 | Disposition: A | Discharge status: Home / Self Care | Source: Ambulatory Visit | Attending: Family Medicine | Admitting: Family Medicine

## 2017-05-13 ENCOUNTER — Ambulatory Visit: Payer: Medicare Other

## 2017-05-13 ENCOUNTER — Encounter: Payer: Self-pay | Admitting: Family Medicine

## 2017-05-13 ENCOUNTER — Telehealth: Payer: Self-pay | Admitting: Family Medicine

## 2017-05-13 ENCOUNTER — Ambulatory Visit (INDEPENDENT_AMBULATORY_CARE_PROVIDER_SITE_OTHER): Payer: Medicare Other | Admitting: Family Medicine

## 2017-05-13 VITALS — BP 120/70 | HR 80

## 2017-05-13 DIAGNOSIS — R229 Localized swelling, mass and lump, unspecified: Secondary | ICD-10-CM | POA: Diagnosis not present

## 2017-05-13 DIAGNOSIS — M7989 Other specified soft tissue disorders: Secondary | ICD-10-CM | POA: Insufficient documentation

## 2017-05-13 DIAGNOSIS — F0390 Unspecified dementia without behavioral disturbance: Secondary | ICD-10-CM | POA: Diagnosis not present

## 2017-05-13 DIAGNOSIS — F03C0 Unspecified dementia, severe, without behavioral disturbance, psychotic disturbance, mood disturbance, and anxiety: Secondary | ICD-10-CM

## 2017-05-13 NOTE — Progress Notes (Addendum)
BP 120/70   Pulse 80    CC: L arm swelling, pain Subjective:    Patient ID: [name], female    DOB: 1925/07/31, 83 y.o.   MRN: [id number]  HPI: Talyn Eddie is a 83 y.o. female presenting on 05/13/2017 for Arm Swelling (LEFT, since yesterday morning, pain. )   Here with son. Hospice patient. Story limited by dementia.   1d h/o L arm pain and swelling. Today swelling progressed to entire arm. Somewhat tender.   Mobility - chair to couch to bed. nonambulatory.  No known inciting trauma/injury. No fevers.   Relevant past medical, surgical, family and social history reviewed and updated as indicated. Interim medical history since our last visit reviewed. Allergies and medications reviewed and updated. Outpatient Medications Prior to Visit  Medication Sig Dispense Refill  . Alum & Mag Hydroxide-Simeth (ALMACONE II PO) 5 mls by mouth every 6 hours as needed for indigestion    . aspirin 81 MG chewable tablet Chew 81 mg by mouth daily.    . benzonatate (TESSALON) 100 MG capsule Take 1 capsule (100 mg total) by mouth at bedtime. And otherwise tid as needed    . citalopram (CELEXA) 20 MG tablet TAKE 1 & 1/2 TABLETS BY MOUTH IN THE MORNING. (DEPRESSION) 45 tablet 6  . cyanocobalamin 500 MCG tablet Take 500 mcg by mouth daily.    Marland Kitchen ENSURE (ENSURE) Give 1 can every day 30 minutes before lunch    . ferrous fumarate (HEMOCYTE - 106 MG FE) 325 (106 Fe) MG TABS tablet Take 1 tablet by mouth daily.    . fluticasone (FLONASE) 50 MCG/ACT nasal spray Place 2 sprays into both nostrils daily. 16 g 3  . ipratropium-albuterol (DUONEB) 0.5-2.5 (3) MG/3ML SOLN Take 3 mLs by nebulization 2 (two) times daily. for 3 days then bid prn for cough and congestion. 360 mL 0  . isosorbide mononitrate (IMDUR) 30 MG 24 hr tablet TAKE ONE TABLET BY MOUTH TWICE DAILY FOR HEART. * DO NOT CRUSH * 30 tablet 3  . levothyroxine (SYNTHROID, LEVOTHROID) 75 MCG tablet Take 1 tablet (75 mcg total)  by mouth daily before breakfast. 30 tablet 11  . loratadine (CLARITIN) 5 MG chewable tablet 5 mg a day for 1 week, increase to 10mg  a day if needed thereafter for runny nose. 60 tablet 12  . mineral oil (FLEET MINERAL OIL) enema Insert 1 per day as needed for constipation    . Multiple Vitamins-Minerals (PRESERVISION AREDS) TABS Take 1 tablet by mouth daily. TAKE ONE TABLET BY MOUTH ONCE DAILY IN THE AM FOR SUPPLEMENT 30 tablet 3  . nitroGLYCERIN (NITROSTAT) 0.4 MG SL tablet Place 0.4 mg under the tongue every 5 (five) minutes as needed.      . pantoprazole (PROTONIX) 40 MG tablet TAKE 1 TABLET BY MOUTH EACH MORNING. (GERD) **DO NOT CRUSH** 30 tablet 11  . Polyethylene Glycol 3350 (MIRALAX PO) Take by mouth. Mix 17 gm in 8 oz of juice    . RANEXA 500 MG 12 hr tablet TAKE ONE TABLET BY MOUTH TWICE A DAY.*DO NOT CRUSH* 30 tablet 3  . sennosides-docusate sodium (SENOKOT-S) 8.6-50 MG tablet Take 2 tablets by mouth 2 (two) times daily.    Marland Kitchen sulfamethoxazole-trimethoprim (BACTRIM DS,SEPTRA DS) 800-160 MG tablet Take 1 tablet by mouth 2 (two) times daily. 14 tablet 0  . traZODone (DESYREL) 50 MG tablet TAKE 1/2 TABLET BY MOUTH EACH NIGHT AT BEDTIME. (SLEEP/DEPRESSION) 15 tablet 11  . Witch Hazel (PREPARATION  H EX) Apply thin layer to external hemorrhoids twice daily after each bowel movement    . oxyCODONE (OXY IR/ROXICODONE) 5 MG immediate release tablet Take 0.5 tablets (2.5 mg total) by mouth 2 (two) times daily as needed for severe pain. 30 tablet 0   No facility-administered medications prior to visit.      Per HPI unless specifically indicated in ROS section below Review of Systems     Objective:    BP 120/70   Pulse 80   Wt Readings from Last 3 Encounters:  06/14/16 104 lb 6.4 oz (47.4 kg)  04/16/16 106 lb 12 oz (48.4 kg)  02/13/16 112 lb (50.8 kg)    Physical Exam  Constitutional: Vital signs are normal.  Thin, weak In wheelchair, unable to stand  HENT:  Mouth/Throat: No  oropharyngeal exudate.  Dry MM  Cardiovascular: Normal rate, regular rhythm, normal heart sounds and intact distal pulses.   No murmur heard. Pulmonary/Chest: Effort normal. No respiratory distress. She has decreased breath sounds. She has no wheezes. She has no rales.  Crackles bibasilarly  Musculoskeletal: She exhibits edema.  R arm WNL L arm marked 2+ pitting edema from mid upper arm down through hand Pain to palpation at humeral shaft Soft tissue mass left inner upper arm just distal to axilla 2+ rad pulses R, 1+ rad pulses L  Skin: No rash noted. No erythema.  No warmth  Nursing note and vitals reviewed.  Results for orders placed or performed in visit on 02/09/17  CBC  Result Value Ref Range   WBC 5.8    HGB 7.6 g/dL   MCV 98    platelet count 205    LEFT HUMERUS - 2+ VIEW COMPARISON:  None. FINDINGS: Degenerative changes of the acromioclavicular joint are seen. The humerus is well visualized. No fracture or dislocation is seen. No soft tissue abnormality is noted. IMPRESSION: No acute abnormality seen. Electronically Signed   By: Inez Catalina M.D.   On: 05/13/2017 17:06  Left UPPER EXTREMITY VENOUS DOPPLER ULTRASOUND IMPRESSION: No evidence of DVT within the left upper extremity. Large 6.9 cm heterogenous solid appearing mass in the left axilla. CT recommended for further evaluation. Electronically Signed   By: Donavan Foil M.D.   On: 05/13/2017 17:50    Assessment & Plan:   Problem List Items Addressed This Visit    Left arm swelling - Primary    New. No inciting trauma/injury. Check films to r/o fracture. Check [place] to r/o DVT. Sent to hospital for imaging studies today as we cannot do. Will call with results to son's phone.       Relevant Orders   DG Humerus Left (Completed)   US Venous Img Upper Uni Left (Completed)   Severe dementia       ADDENDUM ==> spoke with son regarding results [place] - 6.9cm L axillary mass. Discussed I anticipate either  lymphoma or other soft tissue malignancy. Discussed possible further evaluation with CT scan. Son doesn't think mother or family would want further evaluation at this time, or any aggressive intervention. Reviewed supportive care - will recommend elevation of arm as able. Provided my cell to son to call with any questions or concerns over weekend. We will be in touch with hospice on Monday for further supportive strategies - ?diuretic vs compression sleeve for arm.   Follow up plan: No Follow-up on file.  Ria Bush, MD

## 2017-05-13 NOTE — Telephone Encounter (Signed)
plz contact hospice to notify them of new diagnosis of mass L axilla leading to LUE edema  Family at this time has declined further evaluation/imaging. Recommend elevation of arm as much as able, could consider compression wrap or sleeve to arm or low dose diuretic.  Continue oxycodone PRN pain.

## 2017-05-13 NOTE — Patient Instructions (Signed)
Go to [facility] for xray and ultrasound.  We will call you with results and plan.

## 2017-05-13 NOTE — Telephone Encounter (Signed)
Received call regarding her arm doppler from answering service- however it appears that Dr. Darnell Level has already taken care of this issue and contacted pts family

## 2017-05-13 NOTE — Assessment & Plan Note (Addendum)
New. No inciting trauma/injury. Check films to r/o fracture. Check [place] to r/o DVT. Sent to hospital for imaging studies today as we cannot do. Will call with results to son's phone.

## 2017-05-16 ENCOUNTER — Ambulatory Visit: Payer: Medicare Other

## 2017-05-16 ENCOUNTER — Telehealth: Payer: Self-pay

## 2017-05-16 NOTE — Telephone Encounter (Signed)
Ok to do this - thanks.  New sig: 1/2 tab BID scheduled. rec take daily stool softener like colace 100mg  while on daily narcotic.   plz also contact hospice to notify them of new diagnosis of mass L axilla leading to LUE edema  Family at this time has declined further evaluation/imaging. Recommend elevation of arm as much as able, could consider compression wrap or sleeve to arm or low dose diuretic.

## 2017-05-16 NOTE — Telephone Encounter (Signed)
[name] with [facility] left v/m; pt cannot remember to request pain med; pt is having constant pain. [name] wants to know if oxycodone could be scheduled twice a day for pain. Pt is resident of [facility]. [place]. Last refilled # 30 on 05/10/17. [name] request cb.(did not change instructions for oxycodone until approved by Dr Darnell Level.

## 2017-05-17 NOTE — Telephone Encounter (Signed)
Spoke with [facility] gave a verbal for the reccomendations of Dr Darnell Level.

## 2017-05-19 ENCOUNTER — Telehealth: Payer: Self-pay

## 2017-05-19 NOTE — Telephone Encounter (Signed)
Noted. plz fax office visit

## 2017-05-19 NOTE — Telephone Encounter (Addendum)
[name] nurse with [facility] left v/m; pt has recently found mass on lt axilla; pt has on and off edema to lt arm; today noted bruising to the lt arm; appears a yellowish tan color; pt has radial and brachial pulse present. Pt does not seem to be uncomfortable. Can call [name] if needed. [name] also request 05/13/17 office visit faxed to [place] at [phone].

## 2017-05-20 NOTE — Telephone Encounter (Signed)
OV note faxed as requested.

## 2017-06-15 ENCOUNTER — Telehealth: Payer: Self-pay

## 2017-06-15 DIAGNOSIS — K068 Other specified disorders of gingiva and edentulous alveolar ridge: Secondary | ICD-10-CM | POA: Insufficient documentation

## 2017-06-15 MED ORDER — ASPIRIN 81 MG PO CHEW: 81.0000 mg | CHEWABLE_TABLET | ORAL | Status: AC

## 2017-06-15 NOTE — Telephone Encounter (Signed)
Hold aspirin for 1 week then start MWF aspirin dosing

## 2017-06-15 NOTE — Telephone Encounter (Signed)
[name] with [facility] left v/m; pt having bleeding from gums again and [name] wants to know if can hold ASA.Please advise.

## 2017-06-16 NOTE — Telephone Encounter (Signed)
Spoke with [facility] gave reccomendations

## 2017-06-27 ENCOUNTER — Other Ambulatory Visit: Payer: Self-pay | Admitting: Family Medicine

## 2017-06-28 ENCOUNTER — Telehealth: Payer: Self-pay | Admitting: Family Medicine

## 2017-06-30 NOTE — Telephone Encounter (Signed)
Tried to call again, straight to voicemail.

## 2017-07-02 NOTE — Telephone Encounter (Signed)
Received message [place].

## 2017-07-27 NOTE — Telephone Encounter (Signed)
Called again, no answer. Will try later

## 2017-07-27 NOTE — Telephone Encounter (Signed)
Received request for death certificate. Will touch base with family.

## 2017-07-27 NOTE — Telephone Encounter (Signed)
Called son at cell and home # - direct to voice mail. Will try to call later.

## 2017-07-27 NOTE — Telephone Encounter (Signed)
Becky Adkins called to let Dr.Gutierrez know patient passed away today at 2:45 am.

## 2017-07-27 NOTE — Telephone Encounter (Signed)
Has been faxed.

## 2017-07-27 NOTE — Telephone Encounter (Signed)
Death certificate filled out and in [place] box.

## 2017-07-27 DEATH — deceased
